# Patient Record
Sex: Male | Born: 1950
Health system: Southern US, Community
[De-identification: ages and names within clinical notes are randomized; demographics above are authoritative.]

## PROBLEM LIST (undated history)

## (undated) DIAGNOSIS — E785 Hyperlipidemia, unspecified: Secondary | ICD-10-CM

## (undated) DIAGNOSIS — E119 Type 2 diabetes mellitus without complications: Secondary | ICD-10-CM

---

## 2000-01-05 ENCOUNTER — Encounter: Payer: Self-pay | Admitting: *Deleted

## 2000-01-05 ENCOUNTER — Encounter: Payer: Self-pay | Admitting: Emergency Medicine

## 2000-01-05 ENCOUNTER — Inpatient Hospital Stay (HOSPITAL_COMMUNITY): Admission: EM | Admit: 2000-01-05 | Discharge: 2000-01-07 | Payer: Self-pay | Admitting: Emergency Medicine

## 2000-01-15 ENCOUNTER — Encounter: Admission: RE | Admit: 2000-01-15 | Discharge: 2000-02-12 | Payer: Self-pay | Admitting: *Deleted

## 2000-07-04 ENCOUNTER — Emergency Department (HOSPITAL_COMMUNITY): Admission: EM | Admit: 2000-07-04 | Discharge: 2000-07-04 | Payer: Self-pay | Admitting: Emergency Medicine

## 2001-11-28 ENCOUNTER — Encounter: Payer: Self-pay | Admitting: *Deleted

## 2001-11-28 ENCOUNTER — Inpatient Hospital Stay (HOSPITAL_COMMUNITY): Admission: EM | Admit: 2001-11-28 | Discharge: 2001-11-29 | Payer: Self-pay | Admitting: Psychiatry

## 2001-12-05 ENCOUNTER — Emergency Department (HOSPITAL_COMMUNITY): Admission: EM | Admit: 2001-12-05 | Discharge: 2001-12-05 | Payer: Self-pay | Admitting: Emergency Medicine

## 2001-12-07 ENCOUNTER — Emergency Department (HOSPITAL_COMMUNITY): Admission: AC | Admit: 2001-12-07 | Discharge: 2001-12-07 | Payer: Self-pay

## 2001-12-07 ENCOUNTER — Encounter: Payer: Self-pay | Admitting: Emergency Medicine

## 2002-03-05 ENCOUNTER — Encounter: Admission: RE | Admit: 2002-03-05 | Discharge: 2002-03-05 | Payer: Self-pay | Admitting: Family Medicine

## 2002-03-05 ENCOUNTER — Encounter: Payer: Self-pay | Admitting: Family Medicine

## 2002-03-19 ENCOUNTER — Encounter: Payer: Self-pay | Admitting: Family Medicine

## 2002-03-19 ENCOUNTER — Encounter: Admission: RE | Admit: 2002-03-19 | Discharge: 2002-03-19 | Payer: Self-pay | Admitting: Family Medicine

## 2003-07-13 DIAGNOSIS — I639 Cerebral infarction, unspecified: Secondary | ICD-10-CM

## 2003-07-13 HISTORY — DX: Cerebral infarction, unspecified: I63.9

## 2004-10-01 ENCOUNTER — Encounter: Admission: RE | Admit: 2004-10-01 | Discharge: 2004-10-01 | Payer: Self-pay | Admitting: Rheumatology

## 2005-09-23 ENCOUNTER — Encounter: Admission: RE | Admit: 2005-09-23 | Discharge: 2005-09-23 | Payer: Self-pay | Admitting: Neurology

## 2006-10-13 ENCOUNTER — Encounter: Admission: RE | Admit: 2006-10-13 | Discharge: 2006-10-13 | Payer: Self-pay | Admitting: Family Medicine

## 2013-09-28 ENCOUNTER — Other Ambulatory Visit: Payer: Self-pay | Admitting: Family Medicine

## 2013-09-28 DIAGNOSIS — R945 Abnormal results of liver function studies: Principal | ICD-10-CM

## 2013-09-28 DIAGNOSIS — R7989 Other specified abnormal findings of blood chemistry: Secondary | ICD-10-CM

## 2013-10-03 ENCOUNTER — Ambulatory Visit
Admission: RE | Admit: 2013-10-03 | Discharge: 2013-10-03 | Disposition: A | Discharge status: Home / Self Care | Payer: Federal, State, Local not specified - PPO | Source: Ambulatory Visit | Attending: Family Medicine | Admitting: Family Medicine

## 2013-10-03 DIAGNOSIS — R945 Abnormal results of liver function studies: Principal | ICD-10-CM

## 2013-10-03 DIAGNOSIS — R7989 Other specified abnormal findings of blood chemistry: Secondary | ICD-10-CM

## 2014-11-13 DIAGNOSIS — Z0000 Encounter for general adult medical examination without abnormal findings: Secondary | ICD-10-CM | POA: Diagnosis not present

## 2014-11-13 DIAGNOSIS — N529 Male erectile dysfunction, unspecified: Secondary | ICD-10-CM | POA: Diagnosis not present

## 2014-11-13 DIAGNOSIS — Z1211 Encounter for screening for malignant neoplasm of colon: Secondary | ICD-10-CM | POA: Diagnosis not present

## 2014-11-13 DIAGNOSIS — N400 Enlarged prostate without lower urinary tract symptoms: Secondary | ICD-10-CM | POA: Diagnosis not present

## 2014-11-13 DIAGNOSIS — Z23 Encounter for immunization: Secondary | ICD-10-CM | POA: Diagnosis not present

## 2014-11-13 DIAGNOSIS — Z125 Encounter for screening for malignant neoplasm of prostate: Secondary | ICD-10-CM | POA: Diagnosis not present

## 2014-11-13 DIAGNOSIS — M549 Dorsalgia, unspecified: Secondary | ICD-10-CM | POA: Diagnosis not present

## 2014-11-13 DIAGNOSIS — D649 Anemia, unspecified: Secondary | ICD-10-CM | POA: Diagnosis not present

## 2014-11-13 DIAGNOSIS — Z8673 Personal history of transient ischemic attack (TIA), and cerebral infarction without residual deficits: Secondary | ICD-10-CM | POA: Diagnosis not present

## 2014-11-13 DIAGNOSIS — E780 Pure hypercholesterolemia: Secondary | ICD-10-CM | POA: Diagnosis not present

## 2014-11-13 DIAGNOSIS — Z87891 Personal history of nicotine dependence: Secondary | ICD-10-CM | POA: Diagnosis not present

## 2014-11-15 ENCOUNTER — Other Ambulatory Visit: Payer: Self-pay | Admitting: Family Medicine

## 2014-11-15 DIAGNOSIS — Z139 Encounter for screening, unspecified: Secondary | ICD-10-CM

## 2014-11-21 ENCOUNTER — Ambulatory Visit
Admission: RE | Admit: 2014-11-21 | Discharge: 2014-11-21 | Disposition: A | Discharge status: Home / Self Care | Payer: Medicare Other | Source: Ambulatory Visit | Attending: Family Medicine | Admitting: Family Medicine

## 2014-11-21 DIAGNOSIS — Z139 Encounter for screening, unspecified: Secondary | ICD-10-CM

## 2014-11-21 DIAGNOSIS — Z136 Encounter for screening for cardiovascular disorders: Secondary | ICD-10-CM | POA: Diagnosis not present

## 2014-11-21 DIAGNOSIS — Z1211 Encounter for screening for malignant neoplasm of colon: Secondary | ICD-10-CM | POA: Diagnosis not present

## 2014-11-21 DIAGNOSIS — E785 Hyperlipidemia, unspecified: Secondary | ICD-10-CM | POA: Diagnosis not present

## 2015-02-17 DIAGNOSIS — E780 Pure hypercholesterolemia: Secondary | ICD-10-CM | POA: Diagnosis not present

## 2015-03-31 DIAGNOSIS — Z23 Encounter for immunization: Secondary | ICD-10-CM | POA: Diagnosis not present

## 2015-05-26 DIAGNOSIS — E782 Mixed hyperlipidemia: Secondary | ICD-10-CM | POA: Diagnosis not present

## 2016-04-06 DIAGNOSIS — Z1211 Encounter for screening for malignant neoplasm of colon: Secondary | ICD-10-CM | POA: Diagnosis not present

## 2016-04-06 DIAGNOSIS — N400 Enlarged prostate without lower urinary tract symptoms: Secondary | ICD-10-CM | POA: Diagnosis not present

## 2016-04-06 DIAGNOSIS — D649 Anemia, unspecified: Secondary | ICD-10-CM | POA: Diagnosis not present

## 2016-04-06 DIAGNOSIS — Z23 Encounter for immunization: Secondary | ICD-10-CM | POA: Diagnosis not present

## 2016-04-06 DIAGNOSIS — R634 Abnormal weight loss: Secondary | ICD-10-CM | POA: Diagnosis not present

## 2016-04-06 DIAGNOSIS — Z8673 Personal history of transient ischemic attack (TIA), and cerebral infarction without residual deficits: Secondary | ICD-10-CM | POA: Diagnosis not present

## 2016-04-06 DIAGNOSIS — N529 Male erectile dysfunction, unspecified: Secondary | ICD-10-CM | POA: Diagnosis not present

## 2016-04-06 DIAGNOSIS — E782 Mixed hyperlipidemia: Secondary | ICD-10-CM | POA: Diagnosis not present

## 2016-04-06 DIAGNOSIS — Z125 Encounter for screening for malignant neoplasm of prostate: Secondary | ICD-10-CM | POA: Diagnosis not present

## 2016-04-06 DIAGNOSIS — Z0000 Encounter for general adult medical examination without abnormal findings: Secondary | ICD-10-CM | POA: Diagnosis not present

## 2016-04-06 DIAGNOSIS — M549 Dorsalgia, unspecified: Secondary | ICD-10-CM | POA: Diagnosis not present

## 2016-04-09 ENCOUNTER — Other Ambulatory Visit: Payer: Self-pay

## 2016-04-09 NOTE — Patient Outreach (Signed)
[place] [facility]) Care Management  04/09/2016  Anthony Carter [birth year] [id number]  REFERRAL DATE:  04/08/16  REFERRAL SOURCE:  Primary MD REFERRAL REASON:  Weight loss CONSENT: self/ patient  PROVIDER:  Dr. Mayra Neer  SOCIAL:  Lives alone Independent Can provide own transportation  SUBJECTIVE: Telephone call to patient regarding primary MD referral. HIPAA verified with patient.  Discussed and offered [facility] care management services. Patient verbally agreed to services.  Weight loss: Patient states he has been dealing with weight loss since the beginning of this year. Patient reports he has loss 15-20 lbs and continues to lose. Patient states he saw his primary MD on 04/06/16 and she voiced concern and recommended protein supplements.  Patient states his current weight is 181 lbs and his height is 5 ft 9 in.  Patient states he has not had a good appetite and states he has been under a lot of stress lately.  MEDICATIONS:  Patient states he is having trouble affording his cholesterol medication, simvastatin and medication cialis.  Patient states he has to go without it sometimes.  SOCIAL:  Patient states he has been under a lot of stress lately.  Patient states some of the stress is involving his children. Patient states he feels like this is why he has not been eating well lately. Patient verbalized interest in discussing advance directive Patient verbally agreed to next outreach with telephonic nurse case manager.   ASSESSMENT: PHQ2 - 2,  PHQ9 - 12.   Patient agreeable to additional follow up with telephonic nurse case manager, social worker and pharmacist.  PLAN: RNCM will refer patient to social worker and pharmacist [facility] will send patient [facility] care management welcome packet/ consent/ advance directive.  RNCM will send patient Education material on high protein/ calorie diet.   Quinn Plowman RN,BSN,CCM [facility] Telephonic  [phone]

## 2016-04-09 NOTE — Addendum Note (Signed)
Addended by: Quinn Plowman E on: 04/09/2016 01:13 PM   Modules accepted: Orders

## 2016-04-12 ENCOUNTER — Other Ambulatory Visit: Payer: Self-pay | Admitting: Licensed Clinical Social Worker

## 2016-04-12 NOTE — Patient Outreach (Signed)
[place] [facility]) Care Management  04/12/2016  SNOWDEN FAFARD [birth year] [id number]   Assessment-CSW completed initial outreach attempt today. CSW unable to reach patient successfully. CSW left a HIPPA compliant voice message encouraging patient to return call once available.  Plan-CSW will await return call or complete an additional outreach if needed.  Eula Fried, BSW, MSW, [email or web address] Phone: [phone] Fax: [phone]

## 2016-04-14 ENCOUNTER — Encounter: Payer: Self-pay | Admitting: Licensed Clinical Social Worker

## 2016-04-14 ENCOUNTER — Other Ambulatory Visit: Payer: Self-pay | Admitting: Licensed Clinical Social Worker

## 2016-04-14 NOTE — Patient Outreach (Signed)
[place] [facility]) Care Management  04/14/2016  Anthony Carter [birth year] [id number]   Assessment- CSW completed outreach to patient and patient answered successfully. Patient provided HIPPA verifications. CSW introduced self, reason for call and of [facility] services. Patient is agreeable to social work services and is agreeable to home visit next week in order to gain social work assistance with advance directives and mental health resources.  Plan-CSW will complete home visit on 04/20/16. CSW will send involvement letter to PCP.  THN CM Care Plan Problem One   Flowsheet Row Most Recent Value  Care Plan Problem One  Patient is in need of community resource support and advance directives  Role Documenting the Problem One  Clinical Social Worker  Care Plan for Problem One  Active  THN Long Term Goal (31-90 days)  Patient will gain mental health resources within 90 day days as evidenced by his ongoing depressive symptoms which has led weight loss  THN Long Term Goal Start Date  04/14/16  Interventions for Problem One Long Term Goal  CSW will complete home visit and will provide resource education on mental health resources and will provide re education as needed. CSW will encourage patient to discuss symptoms with all care providers.  THN CM Short Term Goal #1 (0-30 days)  Patient will be provided assistance to complete advance directives within 30 days as evidenced by his desire to complete advance directives   THN CM Short Term Goal #1 Start Date  04/14/16  Interventions for Short Term Goal #1  CSW will complete home visit within 30 days in order to assist with completing advance directives.        Eula Fried, BSW, MSW, [email or web address] Phone: [phone] Fax: [phone]

## 2016-04-15 ENCOUNTER — Ambulatory Visit: Payer: Self-pay

## 2016-04-16 ENCOUNTER — Other Ambulatory Visit: Payer: Self-pay

## 2016-04-16 NOTE — Patient Outreach (Signed)
I called Mr. Garry to determine need with assistance with medications.  I had to leave a HIPPA compliant message for the patient to call me back.  If I do not hear back from the patient today, I will call the patient back at a later date.   Deanne Coffer, PharmD, [place] [phone]

## 2016-04-19 ENCOUNTER — Other Ambulatory Visit: Payer: Self-pay

## 2016-04-20 ENCOUNTER — Other Ambulatory Visit: Payer: Self-pay | Admitting: Licensed Clinical Social Worker

## 2016-04-20 NOTE — Patient Outreach (Signed)
[name]) Care Management  04/20/2016  Anthony Carter [birth year] [id number]  Late entry for 10//17  Telephone call to patient regarding primary MD referral.  Unable to reach patient. HIPAA compliant voice message left with call back phone number.   PLAN; RNCM will attempt 2nd telephone call to patient within 1 week.   Quinn Plowman RN,BSN,CCM [facility] Telephonic  [phone]

## 2016-04-20 NOTE — Patient Outreach (Signed)
[place] [facility]) Care Management  04/20/2016  JOSPH RIMANDO 05-29-1951 [id number]   Assessment- CSW contacted patient before going to his residence in order to complete scheduled home visit but was unable to reach him. CSW arrived at patient's residence and knocked on the door for 20 minutes before leaving. CSW left two HIPPA compliant voice messages on his mobile number requesting that he contact CSW back to reschedule no show home visit appointment.  Plan-CSW will await for return call from patient or complete second outreach attempt within two weeks.  Eula Fried, BSW, MSW, [email or web address] Phone: [phone] Fax: [phone]

## 2016-04-21 ENCOUNTER — Ambulatory Visit: Payer: Self-pay

## 2016-04-23 ENCOUNTER — Other Ambulatory Visit: Payer: Self-pay

## 2016-04-23 NOTE — Patient Outreach (Signed)
Mr. Poitevint was called to discussed his options for assistance with his medications.  His PHI was verified.  He stated he had only spent about $100 out of pocket on his medications.  He was aware of the simvastatin price of about $2.09 and picked it up a few days ago.  He was also aware that Medicare does not cover Cialis and he does not meet the out of pocket spend to get assistant through the [facility].  Mr. Mcfeaters understands that we are not able to provide assistance with Cialis.  If there are other pharmacy issues that arise, I am happy to assist in the future.  I am closing the case to pharmacy.   Deanne Coffer, PharmD, [place] [phone]

## 2016-04-26 ENCOUNTER — Other Ambulatory Visit: Payer: Self-pay | Admitting: Licensed Clinical Social Worker

## 2016-04-26 NOTE — Patient Outreach (Signed)
[place] [facility]) Care Management  04/26/2016  Anthony Carter 1951/06/12 [id number]  Assessment-CSW completed second outreach attempt today to both contact numbers. CSW unable to reach patient successfully. CSW left a HIPPA compliant voice message encouraging patient to return call once available.  Plan-CSW will await return call or complete third and final outreach if needed within one week.  Eula Fried, BSW, MSW, [email or web address] Phone: [phone] Fax: [phone]

## 2016-04-29 DIAGNOSIS — Z1211 Encounter for screening for malignant neoplasm of colon: Secondary | ICD-10-CM | POA: Diagnosis not present

## 2016-04-30 ENCOUNTER — Other Ambulatory Visit: Payer: Self-pay | Admitting: Licensed Clinical Social Worker

## 2016-04-30 ENCOUNTER — Other Ambulatory Visit: Payer: Self-pay

## 2016-04-30 NOTE — Patient Outreach (Signed)
[place] [facility]) Care Management  04/30/2016  Anthony Carter Aug 11, 1950 [id number]  Second telephone call to patient.  Unable to reach patient at both listed phone numbers.  HIPAA compliant voice message left with call back phone number.   PLAN:  RNCM will attempt 3rd telephone outreach to patient within 1 week.   Quinn Plowman RN,BSN,CCM [facility] Telephonic  [phone]

## 2016-04-30 NOTE — Patient Outreach (Signed)
[place] [facility]) Care Management  04/30/2016  Anthony Carter 12-05-1950 [id number]   Assessment-CSW completed third outreach attempt today. CSW unable to reach patient successfully. CSW left a HIPPA compliant voice message encouraging patient to return call once available.  Plan-CSW will complete fourth and final outreach attempt within one week.   Eula Fried, BSW, MSW, [email or web address] Phone: [phone] Fax: [phone]

## 2016-05-03 ENCOUNTER — Ambulatory Visit: Payer: Self-pay

## 2016-05-03 ENCOUNTER — Other Ambulatory Visit: Payer: Self-pay | Admitting: Licensed Clinical Social Worker

## 2016-05-03 ENCOUNTER — Encounter: Payer: Self-pay | Admitting: Licensed Clinical Social Worker

## 2016-05-03 NOTE — Patient Outreach (Signed)
[place] [facility]) Care Management  05/03/2016  ORONDE KEMPEL January 08, 1951 [id number]   Assessment- CSW completed fourth and final outreach call but was unsuccessful in reaching patient. CSW left a HIPPA compliant voice message. CSW will send outreach barrier letter at this time.  Plan-CSW will send outreach barrier letter at this time and will await 10 business days before completing case closure.  Eula Fried, BSW, MSW, [email or web address] Phone: [phone] Fax: [phone]

## 2016-05-04 ENCOUNTER — Ambulatory Visit: Payer: Self-pay

## 2016-05-07 ENCOUNTER — Other Ambulatory Visit: Payer: Self-pay

## 2016-05-07 NOTE — Patient Outreach (Signed)
[place] [facility]) Care Management  05/07/2016  Anthony Carter 10-26-1950 [id number]  REFERRAL DATE:  04/08/16    REFERRAL SOURCE:  Primary MD REFERRAL REASON:  Weight loss CONSENT: self/ patient  PROVIDER:  Dr. Mayra Neer  SOCIAL:  Lives alone Independent Can provide own transportation  Third telephone call to patient regarding primary MD referral.  Unable to reach patient. HIPAA compliant voice message left with call back phone number.   PLAN; RNCM will send patient outreach letter to attempt contact.   Quinn Plowman RN,BSN,CCM [place] Telephonic  [phone]

## 2016-05-18 ENCOUNTER — Other Ambulatory Visit: Payer: Self-pay | Admitting: Licensed Clinical Social Worker

## 2016-05-18 ENCOUNTER — Encounter: Payer: Self-pay | Admitting: Licensed Clinical Social Worker

## 2016-05-18 NOTE — Patient Outreach (Signed)
[place] [facility]) Care Management  05/18/2016  Anthony Carter [birth year] [id number]   Assessment- CSW has been unable to maintain contact with patient. CSW mailed outreach letter requesting return phone call and has waited 10 business days to hear back without success. CSW will now complete social work discharge.  Plan-CSW will complete social work discharge. CSW will update [facility] RNCM and PCP.   Eula Fried, BSW, MSW, [email or web address] Phone: [phone] Fax: [phone]

## 2016-05-21 ENCOUNTER — Other Ambulatory Visit: Payer: Self-pay

## 2016-05-21 NOTE — Patient Outreach (Signed)
[place] [facility]) Care Management  05/21/2016  Anthony Carter [birth year] [id number]  No response from patient after 3 telephone attempts and letter outreach.    PLAN:  RNCM will refer patient to care management assistant to close due to being unable to contact. RNCM will notify patients primary MD of case closure.   Quinn Plowman RN,BSN,CCM [facility] Telephonic  [phone]

## 2016-07-16 DIAGNOSIS — E782 Mixed hyperlipidemia: Secondary | ICD-10-CM | POA: Diagnosis not present

## 2016-10-18 DIAGNOSIS — E782 Mixed hyperlipidemia: Secondary | ICD-10-CM | POA: Diagnosis not present

## 2016-12-28 DIAGNOSIS — H52229 Regular astigmatism, unspecified eye: Secondary | ICD-10-CM | POA: Diagnosis not present

## 2016-12-28 DIAGNOSIS — Z0100 Encounter for examination of eyes and vision without abnormal findings: Secondary | ICD-10-CM | POA: Diagnosis not present

## 2017-05-18 DIAGNOSIS — M549 Dorsalgia, unspecified: Secondary | ICD-10-CM | POA: Diagnosis not present

## 2017-05-18 DIAGNOSIS — D649 Anemia, unspecified: Secondary | ICD-10-CM | POA: Diagnosis not present

## 2017-05-18 DIAGNOSIS — Z0000 Encounter for general adult medical examination without abnormal findings: Secondary | ICD-10-CM | POA: Diagnosis not present

## 2017-05-18 DIAGNOSIS — E782 Mixed hyperlipidemia: Secondary | ICD-10-CM | POA: Diagnosis not present

## 2017-05-18 DIAGNOSIS — Z1389 Encounter for screening for other disorder: Secondary | ICD-10-CM | POA: Diagnosis not present

## 2017-05-18 DIAGNOSIS — N400 Enlarged prostate without lower urinary tract symptoms: Secondary | ICD-10-CM | POA: Diagnosis not present

## 2017-05-18 DIAGNOSIS — Z8673 Personal history of transient ischemic attack (TIA), and cerebral infarction without residual deficits: Secondary | ICD-10-CM | POA: Diagnosis not present

## 2017-05-18 DIAGNOSIS — N529 Male erectile dysfunction, unspecified: Secondary | ICD-10-CM | POA: Diagnosis not present

## 2017-05-18 DIAGNOSIS — Z23 Encounter for immunization: Secondary | ICD-10-CM | POA: Diagnosis not present

## 2017-06-30 DIAGNOSIS — Z1211 Encounter for screening for malignant neoplasm of colon: Secondary | ICD-10-CM | POA: Diagnosis not present

## 2017-06-30 DIAGNOSIS — D126 Benign neoplasm of colon, unspecified: Secondary | ICD-10-CM | POA: Diagnosis not present

## 2017-07-08 DIAGNOSIS — D126 Benign neoplasm of colon, unspecified: Secondary | ICD-10-CM | POA: Diagnosis not present

## 2018-01-03 DIAGNOSIS — I6930 Unspecified sequelae of cerebral infarction: Secondary | ICD-10-CM | POA: Insufficient documentation

## 2018-01-03 DIAGNOSIS — E782 Mixed hyperlipidemia: Secondary | ICD-10-CM | POA: Insufficient documentation

## 2018-01-04 DIAGNOSIS — M5442 Lumbago with sciatica, left side: Secondary | ICD-10-CM | POA: Insufficient documentation

## 2018-01-04 DIAGNOSIS — I739 Peripheral vascular disease, unspecified: Secondary | ICD-10-CM | POA: Insufficient documentation

## 2018-01-04 DIAGNOSIS — R351 Nocturia: Secondary | ICD-10-CM | POA: Insufficient documentation

## 2019-01-23 DIAGNOSIS — R7303 Prediabetes: Secondary | ICD-10-CM | POA: Insufficient documentation

## 2019-01-25 DIAGNOSIS — K219 Gastro-esophageal reflux disease without esophagitis: Secondary | ICD-10-CM | POA: Insufficient documentation

## 2019-07-13 DIAGNOSIS — I2699 Other pulmonary embolism without acute cor pulmonale: Secondary | ICD-10-CM

## 2019-07-13 HISTORY — DX: Other pulmonary embolism without acute cor pulmonale: I26.99

## 2019-11-07 ENCOUNTER — Ambulatory Visit: Payer: Medicare Other | Admitting: Physician Assistant

## 2019-11-07 ENCOUNTER — Other Ambulatory Visit: Payer: Self-pay

## 2019-11-07 VITALS — BP 133/72 | HR 80 | Temp 98.4°F | Resp 18 | Ht 69.0 in | Wt 176.0 lb

## 2019-11-07 DIAGNOSIS — M79674 Pain in right toe(s): Secondary | ICD-10-CM

## 2019-11-07 DIAGNOSIS — R7303 Prediabetes: Secondary | ICD-10-CM

## 2019-11-07 DIAGNOSIS — I6930 Unspecified sequelae of cerebral infarction: Secondary | ICD-10-CM

## 2019-11-07 DIAGNOSIS — E782 Mixed hyperlipidemia: Secondary | ICD-10-CM

## 2019-11-07 MED ORDER — ONETOUCH VERIO W/DEVICE KIT: 1.0000 [IU] | PACK | Freq: Two times a day (BID) | 0 refills | Status: DC

## 2019-11-07 MED ORDER — ONETOUCH VERIO VI STRP: ORAL_STRIP | 12 refills | Status: DC

## 2019-11-07 MED ORDER — METFORMIN HCL 500 MG PO TABS: 500.0000 mg | ORAL_TABLET | Freq: Every day | ORAL | 2 refills | Status: AC

## 2019-11-07 MED ORDER — ACCU-CHEK AVIVA PLUS VI STRP: ORAL_STRIP | 12 refills | Status: DC

## 2019-11-07 MED ORDER — ACCU-CHEK SOFTCLIX LANCETS MISC
12 refills | Status: DC
Start: 2019-11-07 — End: 2019-11-07

## 2019-11-07 MED ORDER — ONETOUCH DELICA LANCETS 33G MISC: 1.0000 [IU] | Freq: Two times a day (BID) | 9 refills | Status: DC
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
## 2019-11-07 NOTE — Patient Instructions (Addendum)
Your A1c was 6.3, this is considered prediabetes, I recommend lowering the sugar diet start Metformin 500 mg once daily in the morning with breakfast, and check your blood sugar every morning when you are fasting before breakfast and again before dinner.  Keep a log of your blood sugars for your primary care provider to review  Kennieth Rad, PA-C Physician Assistant [place] [email or web address]    Diabetes Mellitus and [place] care is an important part of your health, especially when you have diabetes. Diabetes may cause you to have problems because of poor blood flow (circulation) to your feet and legs, which can cause your skin to:  Become thinner and drier.  Break more easily.  Heal more slowly.  Peel and crack. You may also have nerve damage (neuropathy) in your legs and feet, causing decreased feeling in them. This means that you may not notice minor injuries to your feet that could lead to more serious problems. Noticing and addressing any potential problems early is the best way to prevent future foot problems. How to care for your feet Foot hygiene  Wash your feet daily with warm water and mild soap. Do not use hot water. Then, pat your feet and the areas between your toes until they are completely dry. Do not soak your feet as this can dry your skin.  Trim your toenails straight across. Do not dig under them or around the cuticle. File the edges of your nails with an emery board or nail file.  Apply a moisturizing lotion or petroleum jelly to the skin on your feet and to dry, brittle toenails. Use lotion that does not contain alcohol and is unscented. Do not apply lotion between your toes. Shoes and socks  Wear clean socks or stockings every day. Make sure they are not too tight. Do not wear knee-high stockings since they may decrease blood flow to your legs.  Wear shoes that fit properly and have enough  cushioning. Always look in your shoes before you put them on to be sure there are no objects inside.  To break in new shoes, wear them for just a few hours a day. This prevents injuries on your feet. Wounds, scrapes, corns, and calluses  Check your feet daily for blisters, cuts, bruises, sores, and redness. If you cannot see the bottom of your feet, use a mirror or ask someone for help.  Do not cut corns or calluses or try to remove them with medicine.  If you find a minor scrape, cut, or break in the skin on your feet, keep it and the skin around it clean and dry. You may clean these areas with mild soap and water. Do not clean the area with peroxide, alcohol, or iodine.  If you have a wound, scrape, corn, or callus on your foot, look at it several times a day to make sure it is healing and not infected. Check for: ? Redness, swelling, or pain. ? Fluid or blood. ? Warmth. ? Pus or a bad smell. General instructions  Do not cross your legs. This may decrease blood flow to your feet.  Do not use heating pads or hot water bottles on your feet. They may burn your skin. If you have lost feeling in your feet or legs, you may not know this is happening until it is too late.  Protect your feet from hot and cold by wearing shoes, such as at the beach or on hot pavement.  Schedule a complete foot exam at least once a year (annually) or more often if you have foot problems. If you have foot problems, report any cuts, sores, or bruises to your health care provider immediately. Contact a health care provider if:  You have a medical condition that increases your risk of infection and you have any cuts, sores, or bruises on your feet.  You have an injury that is not healing.  You have redness on your legs or feet.  You feel burning or tingling in your legs or feet.  You have pain or cramps in your legs and feet.  Your legs or feet are numb.  Your feet always feel cold.  You have pain around  a toenail. Get help right away if:  You have a wound, scrape, corn, or callus on your foot and: ? You have pain, swelling, or redness that gets worse. ? You have fluid or blood coming from the wound, scrape, corn, or callus. ? Your wound, scrape, corn, or callus feels warm to the touch. ? You have pus or a bad smell coming from the wound, scrape, corn, or callus. ? You have a fever. ? You have a red line going up your leg. Summary  Check your feet every day for cuts, sores, red spots, swelling, and blisters.  Moisturize feet and legs daily.  Wear shoes that fit properly and have enough cushioning.  If you have foot problems, report any cuts, sores, or bruises to your health care provider immediately.  Schedule a complete foot exam at least once a year (annually) or more often if you have foot problems. This information is not intended to replace advice given to you by your health care provider. Make sure you discuss any questions you have with your health care provider. Document Revised: 03/21/2019 Document Reviewed: 07/30/2016 Elsevier Patient Education  [place].  Preventing Type 2 Diabetes Mellitus Type 2 diabetes (type 2 diabetes mellitus) is a long-term (chronic) disease that affects blood sugar (glucose) levels. Normally, a hormone called insulin allows glucose to enter cells in the body. The cells use glucose for energy. In type 2 diabetes, one or both of these problems may be present:  The body does not make enough insulin.  The body does not respond properly to insulin that it makes (insulin resistance). Insulin resistance or lack of insulin causes excess glucose to build up in the blood instead of going into cells. As a result, high blood glucose (hyperglycemia) develops, which can cause many complications. Being overweight or obese and having an inactive (sedentary) lifestyle can increase your risk for diabetes. Type 2 diabetes can be delayed or prevented by making  certain nutrition and lifestyle changes. What nutrition changes can be made?   Eat healthy meals and snacks regularly. Keep a healthy snack with you for when you get hungry between meals, such as fruit or a handful of nuts.  Eat lean meats and proteins that are low in saturated fats, such as chicken, fish, egg whites, and beans. Avoid processed meats.  Eat plenty of fruits and vegetables and plenty of grains that have not been processed (whole grains). It is recommended that you eat: ? 1?2 cups of fruit every day. ? 2?3 cups of vegetables every day. ? 6?8 oz of whole grains every day, such as oats, whole wheat, bulgur, brown rice, quinoa, and millet.  Eat low-fat dairy products, such as milk, yogurt, and cheese.  Eat foods that contain healthy fats, such as  nuts, avocado, olive oil, and canola oil.  Drink water throughout the day. Avoid drinks that contain added sugar, such as soda or sweet tea.  Follow instructions from your health care provider about specific eating or drinking restrictions.  Control how much food you eat at a time (portion size). ? Check food labels to find out the serving sizes of foods. ? Use a kitchen scale to weigh amounts of foods.  Saute or steam food instead of frying it. Cook with water or broth instead of oils or butter.  Limit your intake of: ? Salt (sodium). Have no more than 1 tsp (2,400 mg) of sodium a day. If you have heart disease or high blood pressure, have less than ? tsp (1,500 mg) of sodium a day. ? Saturated fat. This is fat that is solid at room temperature, such as butter or fat on meat. What lifestyle changes can be made? Activity   Do moderate-intensity physical activity for at least 30 minutes on at least 5 days of the week, or as much as told by your health care provider.  Ask your health care provider what activities are safe for you. A mix of physical activities may be best, such as walking, swimming, cycling, and strength  training.  Try to add physical activity into your day. For example: ? Park in spots that are farther away than usual, so that you walk more. For example, park in a far corner of the parking lot when you go to the office or the grocery store. ? Take a walk during your lunch break. ? Use stairs instead of elevators or escalators. Weight Loss  Lose weight as directed. Your health care provider can determine how much weight loss is best for you and can help you lose weight safely.  If you are overweight or obese, you may be instructed to lose at least 5?7 % of your body weight. Alcohol and Tobacco   Limit alcohol intake to no more than 1 drink a day for nonpregnant women and 2 drinks a day for men. One drink equals 12 oz of beer, 5 oz of wine, or 1 oz of hard liquor.  Do not use any tobacco products, such as cigarettes, chewing tobacco, and e-cigarettes. If you need help quitting, ask your health care provider. Work With [place] Provider  Have your blood glucose tested regularly, as told by your health care provider.  Discuss your risk factors and how you can reduce your risk for diabetes.  Get screening tests as told by your health care provider. You may have screening tests regularly, especially if you have certain risk factors for type 2 diabetes.  Make an appointment with a diet and nutrition specialist (registered dietitian). A registered dietitian can help you make a healthy eating plan and can help you understand portion sizes and food labels. Why are these changes important?  It is possible to prevent or delay type 2 diabetes and related health problems by making lifestyle and nutrition changes.  It can be difficult to recognize signs of type 2 diabetes. The best way to avoid possible damage to your body is to take actions to prevent the disease before you develop symptoms. What can happen if changes are not made?  Your blood glucose levels may keep increasing. Having  high blood glucose for a long time is dangerous. Too much glucose in your blood can damage your blood vessels, heart, kidneys, nerves, and eyes.  You may develop prediabetes or type  2 diabetes. Type 2 diabetes can lead to many chronic health problems and complications, such as: ? Heart disease. ? Stroke. ? Blindness. ? Kidney disease. ? Depression. ? Poor circulation in the feet and legs, which could lead to surgical removal (amputation) in severe cases. Where to find support  Ask your health care provider to recommend a registered dietitian, diabetes educator, or weight loss program.  Look for local or online weight loss groups.  Join a gym, fitness club, or outdoor activity group, such as a walking club. Where to find more information To learn more about diabetes and diabetes prevention, visit:  American Diabetes Association (ADA): www.diabetes.[employer] of Diabetes and Digestive and Kidney Diseases: FindSpin.nl To learn more about healthy eating, visit:  The U.S. Department of Agriculture Scientist, research (physical sciences)), Choose My Plate: http://wiley-williams.com/  Office of Disease Prevention and Health Promotion (ODPHP), Dietary Guidelines: SurferLive.at Summary  You can reduce your risk for type 2 diabetes by increasing your physical activity, eating healthy foods, and losing weight as directed.  Talk with your health care provider about your risk for type 2 diabetes. Ask about any blood tests or screening tests that you need to have. This information is not intended to replace advice given to you by your health care provider. Make sure you discuss any questions you have with your health care provider. Document Revised: 10/20/2018 Document Reviewed: 08/19/2015 Elsevier Patient Education  [place].

## 2019-11-08 ENCOUNTER — Other Ambulatory Visit: Payer: Self-pay | Admitting: Physician Assistant

## 2019-11-08 ENCOUNTER — Other Ambulatory Visit: Payer: Medicare Other | Admitting: *Deleted

## 2019-11-08 ENCOUNTER — Telehealth: Payer: Self-pay

## 2019-11-08 DIAGNOSIS — E782 Mixed hyperlipidemia: Secondary | ICD-10-CM

## 2019-11-08 DIAGNOSIS — R7303 Prediabetes: Secondary | ICD-10-CM

## 2019-11-08 MED ORDER — ACCU-CHEK AVIVA PLUS VI STRP: ORAL_STRIP | 12 refills | Status: AC

## 2019-11-08 MED ORDER — ACCU-CHEK SOFTCLIX LANCETS MISC: 12 refills | Status: AC

## 2019-11-08 MED ORDER — ACCU-CHEK AVIVA PLUS W/DEVICE KIT: 1.0000 [IU] | PACK | Freq: Once | 0 refills | Status: AC

## 2019-11-08 NOTE — Telephone Encounter (Signed)
Pharmacy called stating that insurance does not cover the meter & testing supplies that were sent in. Insurance prefers True Metrix or Accu-Chek

## 2019-11-08 NOTE — Telephone Encounter (Signed)
Noted. Message will be forwarded to provider to change rx.

## 2019-11-10 LAB — MICROALBUMIN / CREATININE URINE RATIO
Creatinine, Urine: 34.6 mg/dL
Microalb/Creat Ratio: <9 mg/g creat (ref 0–29)
Microalbumin, Urine: <3 ug/mL

## 2019-11-10 LAB — COMP. METABOLIC PANEL (12)
AST: 35 IU/L (ref 0–40)
Albumin/Globulin Ratio: 1.2 (ref 1.2–2.2)
Albumin: 4.1 g/dL (ref 3.8–4.8)
Alkaline Phosphatase: 133 IU/L — ABNORMAL HIGH (ref 39–117)
BUN/Creatinine Ratio: 7 — ABNORMAL LOW (ref 10–24)
BUN: 8 mg/dL (ref 8–27)
Bilirubin Total: 0.8 mg/dL (ref 0.0–1.2)
Calcium: 9.3 mg/dL (ref 8.6–10.2)
Chloride: 102 mmol/L (ref 96–106)
Creatinine, Ser: 1.1 mg/dL (ref 0.76–1.27)
GFR calc Af Amer: 78 mL/min/{1.73_m2} (ref >59)
GFR calc non Af Amer: 68 mL/min/{1.73_m2} (ref >59)
Globulin, Total: 3.4 g/dL (ref 1.5–4.5)
Glucose: 130 mg/dL — ABNORMAL HIGH (ref 65–99)
Potassium: 4.1 mmol/L (ref 3.5–5.2)
Sodium: 137 mmol/L (ref 134–144)
Total Protein: 7.5 g/dL (ref 6.0–8.5)

## 2019-11-10 LAB — CBC WITH DIFFERENTIAL/PLATELET
Basophils Absolute: 0.1 10*3/uL (ref 0.0–0.2)
Basos: 1 %
EOS (ABSOLUTE): 0.5 10*3/uL — ABNORMAL HIGH (ref 0.0–0.4)
Eos: 8 %
Hematocrit: 46.1 % (ref 37.5–51.0)
Hemoglobin: 15.3 g/dL (ref 13.0–17.7)
Immature Grans (Abs): 0.1 10*3/uL (ref 0.0–0.1)
Immature Granulocytes: 1 %
Lymphocytes Absolute: 2.6 10*3/uL (ref 0.7–3.1)
Lymphs: 44 %
MCH: 27.2 pg (ref 26.6–33.0)
MCHC: 33.2 g/dL (ref 31.5–35.7)
MCV: 82 fL (ref 79–97)
Monocytes Absolute: 0.5 10*3/uL (ref 0.1–0.9)
Monocytes: 8 %
Neutrophils Absolute: 2.2 10*3/uL (ref 1.4–7.0)
Neutrophils: 38 %
Platelets: 203 10*3/uL (ref 150–450)
RBC: 5.62 x10E6/uL (ref 4.14–5.80)
RDW: 14 % (ref 11.6–15.4)
WBC: 5.8 10*3/uL (ref 3.4–10.8)

## 2019-11-10 LAB — LIPID PANEL
Chol/HDL Ratio: 3.9 ratio (ref 0.0–5.0)
Cholesterol, Total: 164 mg/dL (ref 100–199)
HDL: 42 mg/dL (ref >39)
LDL Chol Calc (NIH): 100 mg/dL — ABNORMAL HIGH (ref 0–99)
Triglycerides: 119 mg/dL (ref 0–149)
VLDL Cholesterol Cal: 22 mg/dL (ref 5–40)

## 2019-11-10 LAB — TSH: TSH: 2.48 u[IU]/mL (ref 0.450–4.500)

## 2019-11-15 ENCOUNTER — Telehealth: Payer: Self-pay

## 2019-11-15 NOTE — Telephone Encounter (Signed)
Staff reached out to patient by phone to communicate information on results. No answer.

## 2019-11-16 ENCOUNTER — Telehealth: Payer: Self-pay

## 2019-11-16 NOTE — Telephone Encounter (Signed)
Pt returned call to go over lab results pt is aware of results and doesn't have any questions or concerns

## 2020-02-17 ENCOUNTER — Encounter (HOSPITAL_COMMUNITY): Payer: Self-pay | Admitting: *Deleted

## 2020-02-17 ENCOUNTER — Emergency Department (HOSPITAL_COMMUNITY): Payer: Medicare HMO

## 2020-02-17 ENCOUNTER — Inpatient Hospital Stay (HOSPITAL_COMMUNITY)
Admission: EM | Admit: 2020-02-17 | Discharge: 2020-02-19 | DRG: 176 | Disposition: A | Discharge status: Home / Self Care | Payer: Medicare HMO | Attending: Internal Medicine | Admitting: Internal Medicine

## 2020-02-17 ENCOUNTER — Other Ambulatory Visit: Payer: Self-pay

## 2020-02-17 DIAGNOSIS — R7303 Prediabetes: Secondary | ICD-10-CM | POA: Diagnosis present

## 2020-02-17 DIAGNOSIS — Z83438 Family history of other disorder of lipoprotein metabolism and other lipidemia: Secondary | ICD-10-CM

## 2020-02-17 DIAGNOSIS — Z79899 Other long term (current) drug therapy: Secondary | ICD-10-CM

## 2020-02-17 DIAGNOSIS — K5900 Constipation, unspecified: Secondary | ICD-10-CM | POA: Diagnosis not present

## 2020-02-17 DIAGNOSIS — E871 Hypo-osmolality and hyponatremia: Secondary | ICD-10-CM | POA: Diagnosis present

## 2020-02-17 DIAGNOSIS — Z833 Family history of diabetes mellitus: Secondary | ICD-10-CM

## 2020-02-17 DIAGNOSIS — I2699 Other pulmonary embolism without acute cor pulmonale: Principal | ICD-10-CM | POA: Diagnosis present

## 2020-02-17 DIAGNOSIS — E119 Type 2 diabetes mellitus without complications: Secondary | ICD-10-CM | POA: Diagnosis present

## 2020-02-17 DIAGNOSIS — I6930 Unspecified sequelae of cerebral infarction: Secondary | ICD-10-CM

## 2020-02-17 DIAGNOSIS — N179 Acute kidney failure, unspecified: Secondary | ICD-10-CM | POA: Diagnosis present

## 2020-02-17 DIAGNOSIS — Z7982 Long term (current) use of aspirin: Secondary | ICD-10-CM

## 2020-02-17 DIAGNOSIS — Z885 Allergy status to narcotic agent status: Secondary | ICD-10-CM

## 2020-02-17 DIAGNOSIS — E785 Hyperlipidemia, unspecified: Secondary | ICD-10-CM | POA: Diagnosis present

## 2020-02-17 DIAGNOSIS — Z20822 Contact with and (suspected) exposure to covid-19: Secondary | ICD-10-CM | POA: Diagnosis present

## 2020-02-17 DIAGNOSIS — I69351 Hemiplegia and hemiparesis following cerebral infarction affecting right dominant side: Secondary | ICD-10-CM

## 2020-02-17 DIAGNOSIS — Z7984 Long term (current) use of oral hypoglycemic drugs: Secondary | ICD-10-CM

## 2020-02-17 DIAGNOSIS — I2609 Other pulmonary embolism with acute cor pulmonale: Secondary | ICD-10-CM

## 2020-02-17 HISTORY — DX: Hyperlipidemia, unspecified: E78.5

## 2020-02-17 HISTORY — DX: Type 2 diabetes mellitus without complications: E11.9

## 2020-02-17 LAB — TROPONIN I (HIGH SENSITIVITY)
Troponin I (High Sensitivity): 13 ng/L (ref <18)
Troponin I (High Sensitivity): 14 ng/L (ref <18)

## 2020-02-17 LAB — COMPREHENSIVE METABOLIC PANEL
ALT: 17 U/L (ref 0–44)
AST: 21 U/L (ref 15–41)
Albumin: 3.4 g/dL — ABNORMAL LOW (ref 3.5–5.0)
Alkaline Phosphatase: 96 U/L (ref 38–126)
Anion gap: 11 (ref 5–15)
BUN: 12 mg/dL (ref 8–23)
CO2: 26 mmol/L (ref 22–32)
Calcium: 9.6 mg/dL (ref 8.9–10.3)
Chloride: 96 mmol/L — ABNORMAL LOW (ref 98–111)
Creatinine, Ser: 1.31 mg/dL — ABNORMAL HIGH (ref 0.61–1.24)
GFR calc Af Amer: >60 mL/min (ref >60)
GFR calc non Af Amer: 55 mL/min — ABNORMAL LOW (ref >60)
Glucose, Bld: 138 mg/dL — ABNORMAL HIGH (ref 70–99)
Potassium: 4.5 mmol/L (ref 3.5–5.1)
Sodium: 133 mmol/L — ABNORMAL LOW (ref 135–145)
Total Bilirubin: 2.5 mg/dL — ABNORMAL HIGH (ref 0.3–1.2)
Total Protein: 8.2 g/dL — ABNORMAL HIGH (ref 6.5–8.1)

## 2020-02-17 LAB — HEMOGLOBIN A1C
Hgb A1c MFr Bld: 6.4 % — ABNORMAL HIGH (ref 4.8–5.6)
Mean Plasma Glucose: 136.98 mg/dL

## 2020-02-17 LAB — CBC
HCT: 44.5 % (ref 39.0–52.0)
Hemoglobin: 14.8 g/dL (ref 13.0–17.0)
MCH: 27.7 pg (ref 26.0–34.0)
MCHC: 33.3 g/dL (ref 30.0–36.0)
MCV: 83.3 fL (ref 80.0–100.0)
Platelets: 189 10*3/uL (ref 150–400)
RBC: 5.34 MIL/uL (ref 4.22–5.81)
RDW: 12.9 % (ref 11.5–15.5)
WBC: 12.7 10*3/uL — ABNORMAL HIGH (ref 4.0–10.5)
nRBC: 0 % (ref 0.0–0.2)

## 2020-02-17 LAB — SARS CORONAVIRUS 2 BY RT PCR (HOSPITAL ORDER, PERFORMED IN ~~LOC~~ HOSPITAL LAB): SARS Coronavirus 2: NEGATIVE

## 2020-02-17 LAB — LIPASE, BLOOD: Lipase: 22 U/L (ref 11–51)

## 2020-02-17 LAB — HEPARIN LEVEL (UNFRACTIONATED): Heparin Unfractionated: 0.48 IU/mL (ref 0.30–0.70)

## 2020-02-17 LAB — CBG MONITORING, ED: Glucose-Capillary: 108 mg/dL — ABNORMAL HIGH (ref 70–99)

## 2020-02-17 LAB — BRAIN NATRIURETIC PEPTIDE: B Natriuretic Peptide: 38.6 pg/mL (ref 0.0–100.0)

## 2020-02-17 MED ORDER — ACETAMINOPHEN 650 MG RE SUPP: 650.0000 mg | Freq: Four times a day (QID) | RECTAL | Status: DC | PRN

## 2020-02-17 MED ORDER — ACETAMINOPHEN 325 MG PO TABS: 650.0000 mg | ORAL_TABLET | Freq: Four times a day (QID) | ORAL | Status: DC | PRN

## 2020-02-17 MED ORDER — HYDROCODONE-ACETAMINOPHEN 5-325 MG PO TABS
1.0000 | ORAL_TABLET | Freq: Four times a day (QID) | ORAL | Status: DC | PRN
  Administered 2020-02-17: 1 via ORAL
  Filled 2020-02-17: qty 1

## 2020-02-17 MED ORDER — SODIUM CHLORIDE 0.9 % IV SOLN: Freq: Once | INTRAVENOUS | Status: AC

## 2020-02-17 MED ORDER — IOHEXOL 350 MG/ML SOLN
100.0000 mL | Freq: Once | INTRAVENOUS | Status: AC | PRN
  Administered 2020-02-17: 100 mL via INTRAVENOUS

## 2020-02-17 MED ORDER — FENTANYL CITRATE (PF) 100 MCG/2ML IJ SOLN
50.0000 ug | Freq: Once | INTRAMUSCULAR | Status: AC
  Administered 2020-02-17: 50 ug via INTRAVENOUS
  Filled 2020-02-17: qty 2

## 2020-02-17 MED ORDER — INSULIN ASPART 100 UNIT/ML ~~LOC~~ SOLN: 0.0000 [IU] | Freq: Three times a day (TID) | SUBCUTANEOUS | Status: DC

## 2020-02-17 MED ORDER — SODIUM CHLORIDE 0.9% FLUSH
3.0000 mL | Freq: Two times a day (BID) | INTRAVENOUS | Status: DC
  Administered 2020-02-18 – 2020-02-19 (×3): 3 mL via INTRAVENOUS

## 2020-02-17 MED ORDER — HEPARIN (PORCINE) 25000 UT/250ML-% IV SOLN
1300.0000 [IU]/h | INTRAVENOUS | Status: DC
  Administered 2020-02-17 – 2020-02-18 (×2): 1300 [IU]/h via INTRAVENOUS
  Filled 2020-02-17 (×2): qty 250

## 2020-02-17 MED ORDER — ONDANSETRON HCL 4 MG/2ML IJ SOLN: 4.0000 mg | Freq: Four times a day (QID) | INTRAMUSCULAR | Status: DC | PRN

## 2020-02-17 MED ORDER — ALBUTEROL SULFATE (2.5 MG/3ML) 0.083% IN NEBU: 2.5000 mg | INHALATION_SOLUTION | Freq: Four times a day (QID) | RESPIRATORY_TRACT | Status: DC | PRN

## 2020-02-17 MED ORDER — HEPARIN BOLUS VIA INFUSION
4700.0000 [IU] | Freq: Once | INTRAVENOUS | Status: AC
  Administered 2020-02-17: 4700 [IU] via INTRAVENOUS
  Filled 2020-02-17: qty 4700

## 2020-02-17 MED ORDER — ONDANSETRON HCL 4 MG PO TABS: 4.0000 mg | ORAL_TABLET | Freq: Four times a day (QID) | ORAL | Status: DC | PRN

## 2020-02-17 NOTE — ED Notes (Signed)
Report given to Elige Radon, RN

## 2020-02-17 NOTE — ED Notes (Signed)
Admitting provider at bedside.

## 2020-02-17 NOTE — ED Notes (Signed)
Pt to CT via WC

## 2020-02-17 NOTE — ED Provider Notes (Signed)
[facility] Emergency Department Provider Note MRN:  [id number]  Arrival date & time: 02/17/20     Chief Complaint   Neck Pain and Back Pain   History of Present Illness   Anthony Carter is a 69 y.o. year-old male with a history of stroke presenting to the ED with chief complaint of neck and back pain.  Gradually worsening pain to the bilateral neck, back, pain down the bilateral flanks into the lower abdomen, neck pain does seem to radiate into the chest occasionally.  Patient lifted a lawnmower last week but it did not cause any significant soreness, pain started 2 days ago, no trauma or any other inciting events.  Pain described as throbbing, severe.  Denies fever, no cough, no shortness of breath though the pain is worse with deep breaths.  Review of Systems  A complete 10 system review of systems was obtained and all systems are negative except as noted in the HPI and PMH.   Patient's Health History   History reviewed. No pertinent past medical history.  History reviewed. No pertinent surgical history.  Family History  Problem Relation Age of Onset  . Hyperlipidemia Mother   . Diabetes Mother   . Hyperlipidemia Father   . Diabetes Father   . Hyperlipidemia Brother     Social History   Socioeconomic History  . Marital status: Married    Spouse name: Not on file  . Number of children: Not on file  . Years of education: Not on file  . Highest education level: Not on file  Occupational History  . Not on file  Tobacco Use  . Smoking status: Never Smoker  . Smokeless tobacco: Never Used  Substance and Sexual Activity  . Alcohol use: Not Currently  . Drug use: Never  . Sexual activity: Not Currently  Other Topics Concern  . Not on file  Social History Narrative  . Not on file   Social Determinants of Health   Financial Resource Strain:   . Difficulty of Paying Living Expenses:   Food Insecurity:   . Worried About Charity fundraiser in  the Last Year:   . Arboriculturist in the Last Year:   Transportation Needs:   . Film/video editor (Medical):   Marland Kitchen Lack of Transportation (Non-Medical):   Physical Activity:   . Days of Exercise per Week:   . Minutes of Exercise per Session:   Stress:   . Feeling of Stress :   Social Connections:   . Frequency of Communication with Friends and Family:   . Frequency of Social Gatherings with Friends and Family:   . Attends Religious Services:   . Active Member of Clubs or Organizations:   . Attends Archivist Meetings:   Marland Kitchen Marital Status:   Intimate Partner Violence:   . Fear of Current or Ex-Partner:   . Emotionally Abused:   Marland Kitchen Physically Abused:   . Sexually Abused:      Physical Exam   Vitals:   02/17/20 0541 02/17/20 1402  BP: 129/82 (!) 109/92  Pulse: 95 93  Resp: 19 16  Temp: 98.6 F (37 C) (!) 97.5 F (36.4 C)  SpO2: 98% 98%    CONSTITUTIONAL: Well-appearing, appears mildly uncomfortable NEURO:  Alert and oriented x 3, no focal deficits EYES:  eyes equal and reactive ENT/NECK:  no LAD, no JVD CARDIO: Regular rate, well-perfused, normal S1 and S2 PULM:  CTAB no wheezing or rhonchi GI/GU:  normal bowel sounds, non-distended, non-tender MSK/SPINE:  No gross deformities, no edema SKIN:  no rash, atraumatic PSYCH:  Appropriate speech and behavior  *Additional and/or pertinent findings included in MDM below  Diagnostic and Interventional Summary    EKG Interpretation  Date/Time:  Sunday February 17 2020 05:57:30 EDT Ventricular Rate:  99 PR Interval:  168 QRS Duration: 74 QT Interval:  296 QTC Calculation: 379 R Axis:   13 Text Interpretation: Normal sinus rhythm Minimal voltage criteria for LVH, may be normal variant ( R in aVL ) Nonspecific T wave abnormality Abnormal ECG Confirmed by Gerlene Fee [phone]) on 02/17/2020 11:19:56 AM      Labs Reviewed  CBC - Abnormal; Notable for the following components:      Result Value   WBC 12.7 (*)      All other components within normal limits  COMPREHENSIVE METABOLIC PANEL - Abnormal; Notable for the following components:   Sodium 133 (*)    Chloride 96 (*)    Glucose, Bld 138 (*)    Creatinine, Ser 1.31 (*)    Total Protein 8.2 (*)    Albumin 3.4 (*)    Total Bilirubin 2.5 (*)    GFR calc non Af Amer 55 (*)    All other components within normal limits  LIPASE, BLOOD  HEPARIN LEVEL (UNFRACTIONATED)  TROPONIN I (HIGH SENSITIVITY)  TROPONIN I (HIGH SENSITIVITY)    CT Angio Chest/Abd/Pel for Dissection W and/or Wo Contrast  Final Result  Addendum 1 of 1  ADDENDUM REPORT: 02/17/2020 14:30    ADDENDUM:  Critical Value/emergent results were called by telephone at the time  of interpretation on 02/17/2020 at 2:27 pm to provider [facility] ,  who verbally acknowledged these results.      Electronically Signed    By: Suzy Bouchard M.D.    On: 02/17/2020 14:30      Final      Medications  heparin bolus via infusion 4,700 Units (has no administration in time range)  heparin ADULT infusion 100 units/mL (25000 units/258mL sodium chloride 0.45%) (has no administration in time range)  fentaNYL (SUBLIMAZE) injection 50 mcg (50 mcg Intravenous Given 02/17/20 1208)  iohexol (OMNIPAQUE) 350 MG/ML injection 100 mL (100 mLs Intravenous Contrast Given 02/17/20 1333)     Procedures  /  Critical Care .Critical Care Performed by: Maudie Flakes, MD Authorized by: Maudie Flakes, MD   Critical care provider statement:    Critical care time (minutes):  34   Critical care was necessary to treat or prevent imminent or life-threatening deterioration of the following conditions: Bilateral acute pulmonary embolism with heart strain.   Critical care was time spent personally by me on the following activities:  Discussions with consultants, evaluation of patient's response to treatment, examination of patient, ordering and performing treatments and interventions, ordering and review of  laboratory studies, ordering and review of radiographic studies, pulse oximetry, re-evaluation of patient's condition, obtaining history from patient or surrogate and review of old charts    ED Course and Medical Decision Making  I have reviewed the triage vital signs, the nursing notes, and pertinent available records from the EMR.  Listed above are laboratory and imaging tests that I personally ordered, reviewed, and interpreted and then considered in my medical decision making (see below for details).      Given the description of pain and patient's age and risk factors there is some concern for aortic dissection, will obtain CT imaging.  CT study revealing  bilateral pulmonary emboli with infarct and mild heart strain, will consult with pulmonary critical care given the heart strain, however patient is hemodynamically stable and may be appropriate for stepdown or hospital service.  Evaluated by critical care, deemed appropriate for stepdown or floor with telemetry, will admit to hospital service.  Barth Kirks. [place] Small, [place] [email or web address]  Final Clinical Impressions(s) / ED Diagnoses     ICD-10-CM   1. Acute pulmonary embolism with acute cor pulmonale, unspecified pulmonary embolism type ([place])  I26.09     ED Discharge Orders    None       Discharge Instructions Discussed with and Provided to Patient:   Discharge Instructions   None       Maudie Flakes, MD 02/17/20 1534

## 2020-02-17 NOTE — Progress Notes (Signed)
[place] for heparin dosing Indication: pulmonary embolus  Allergies  Allergen Reactions  . Codeine Nausea And Vomiting    Patient Measurements: Height: 5\' 9"  (175.3 cm) Weight: 76.9 kg (169 lb 8.5 oz) IBW/kg (Calculated) : 70.7 Heparin Dosing Weight:   Vital Signs: Temp: 99.5 F (37.5 C) (08/08 2123) Temp Source: Oral (08/08 2123) BP: 125/68 (08/08 2123) Pulse Rate: 85 (08/08 2123)  Labs: Recent Labs    02/17/20 1208 02/17/20 1510 02/17/20 2131  HGB 14.8  --   --   HCT 44.5  --   --   PLT 189  --   --   HEPARINUNFRC  --   --  0.48  CREATININE 1.31*  --   --   TROPONINIHS 13 14  --     Estimated Creatinine Clearance: 52.5 mL/min (A) (by C-G formula based on SCr of 1.31 mg/dL (H)).   Medical History: Past Medical History:  Diagnosis Date  . 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 Diabetes ([place])   . HLD (hyperlipidemia)     Assessment: 69 y.o. M presented with right neck pain radiating down to lower back. CTA chest positive for acute PE with CT evidence of right heart strain. No AC PTA. Hgb/Hct stable, Plt wnl. Scr 1.31.   Goal of Therapy:  Heparin level 0.3-0.7 units/ml Monitor platelets by anticoagulation protocol: Yes   Plan:  Continue heparin infusion at 1300 units/hr Daily heparin level and CBC. Continue to monitor H&H and platelets  [place] Crane, Roylene Reason, BCCP Clinical Pharmacist  02/17/2020 10:27 PM   [facility] pharmacy phone numbers are listed on [email or web address]  Please check [email or web address] for unit-specific pharmacy phone numbers.

## 2020-02-17 NOTE — H&P (Addendum)
History and Physical    Anthony Carter RJJ:[id number] DOB: [birth year] DOA: 02/17/2020  Referring MD/NP/PA: Gerlene Fee, MD PCP: Mayra Neer, MD  Patient coming from: home  Chief Complaint: Pain  I have personally briefly reviewed patient's old medical records in [place]   HPI: Anthony Carter is a 69 y.o. 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  Prior to onset of symptoms he had been picking up a lawn more to move him and his neighbors lawn.  Complains of having pulsating severe pain in both of his shoulders.  Pain radiated across his shoulder blades, down his back to his buttock, and back across.  He complained of having difficulty breathing related with pain.  He admits to having associated symptoms of some lower leg pains.  He had been previously traveling back and forth from [place] where he used to live, but had not done so in a month.  He states that the fluid bolus told him that his blood was thick and he always clotted fast, but is not sure why.  Patient had been evaluated last year for an abnormal stress test, but reported that cardiac catheterization showed no acute abnormalities.  Lastly he also reports that he was told that he had a abnormal heart rhythm and that possibly led to his previous stroke.  He takes a daily aspirin, but is not on any blood thinners baseline.  ED Course: Upon admission into the emergency department patient was seen to be afebrile, respirations 16-26, and all other vital signs maintained.  Labs significant for WBC 12.7, sodium 133, BUN 12, creatinine 1.31, and troponin 13.  Bilateral lower lobe acute pulmonary emboli with right heart strain and bilateral pulmonary infarctions.  Patient was started on heparin drip.  TRH called to admit.  Review of Systems  Constitutional: Negative for fever and  malaise/fatigue.  HENT: Negative for congestion and nosebleeds.   Eyes: Negative for photophobia and pain.  Respiratory: Positive for shortness of breath. Negative for cough.   Cardiovascular: Negative for leg swelling.  Gastrointestinal: Negative for abdominal pain, nausea and vomiting.  Genitourinary: Negative for hematuria.  Musculoskeletal: Positive for back pain and myalgias. Negative for falls.  Skin: Negative for itching.  Neurological: Negative for focal weakness and loss of consciousness.  Psychiatric/Behavioral: Negative for memory loss and substance abuse.    Past Medical History:  Diagnosis Date  . Diabetes ([place])     History reviewed. No pertinent surgical history.   reports that he has never smoked. He has never used smokeless tobacco. He reports previous alcohol use. He reports that he does not use drugs.  Allergies  Allergen Reactions  . Codeine Nausea And Vomiting    Family History  Problem Relation Age of Onset  . Hyperlipidemia Mother   . Diabetes Mother   . Hyperlipidemia Father   . Diabetes Father   . Hyperlipidemia Brother     Prior to Admission medications   Medication Sig Start Date End Date Taking? Authorizing Provider  aspirin EC 81 MG tablet Take 81 mg by mouth daily after supper.    Yes Mayra Neer, MD  ibuprofen (ADVIL) 200 MG tablet Take 200 mg by mouth every 6 (six) hours as needed for headache (pain).   Yes [provider]  Multiple Vitamin (MULTIVITAMIN WITH MINERALS) TABS tablet Take 1 tablet by mouth daily after supper.  Yes [provider]  Accu-Chek Softclix Lancets lancets Use as instructed 11/08/19   Mayers, Cari S, PA-C  glucose blood (ACCU-CHEK AVIVA PLUS) test strip Use as instructed 11/08/19   Mayers, Cari S, PA-C  metFORMIN (GLUCOPHAGE) 500 MG tablet Take 1 tablet (500 mg total) by mouth daily with breakfast. Patient not taking: Reported on 02/17/2020 11/07/19 02/05/20  Mayers, Loraine Grip, PA-C    Physical  Exam:  Constitutional: Elderly male currently in NAD, calm, comfortable Vitals:   02/17/20 0541 02/17/20 1402 02/17/20 1506 02/17/20 1527  BP: 129/82 (!) 109/92 133/84   Pulse: 95 93  94  Resp: 19 16  (!) 26  Temp: 98.6 F (37 C) (!) 97.5 F (36.4 C)    TempSrc: Oral Oral    SpO2: 98% 98%  99%  Weight:  79.4 kg     Eyes: PERRL, lids and conjunctivae normal ENMT: Mucous membranes are moist. Posterior pharynx clear of any exudate or lesions.  Neck: normal, supple, no masses, no thyromegaly Respiratory: clear to auscultation bilaterally, no wheezing, no crackles. Normal respiratory effort. No accessory muscle use.  Cardiovascular: Regular rate and rhythm, no murmurs / rubs / gallops. No extremity edema. 2+ pedal pulses. No carotid bruits.  Abdomen: no tenderness, no masses palpated. No hepatosplenomegaly. Bowel sounds positive.  Musculoskeletal: no clubbing / cyanosis. No joint deformity upper and lower extremities. Good ROM, no contractures. Normal muscle tone.  Skin: no rashes, lesions, ulcers. No induration Neurologic: CN 2-12 grossly intact. Sensation intact, DTR normal. Strength 5/5 in all 4.  Psychiatric: Normal judgment and insight. Alert and oriented x 3. Normal mood.     Labs on Admission: I have personally reviewed following labs and imaging studies  CBC: Recent Labs  Lab 02/17/20 1208  WBC 12.7*  HGB 14.8  HCT 44.5  MCV 83.3  PLT 101   Basic Metabolic Panel: Recent Labs  Lab 02/17/20 1208  NA 133*  K 4.5  CL 96*  CO2 26  GLUCOSE 138*  BUN 12  CREATININE 1.31*  CALCIUM 9.6   GFR: Estimated Creatinine Clearance: 52.5 mL/min (A) (by C-G formula based on SCr of 1.31 mg/dL (H)). Liver Function Tests: Recent Labs  Lab 02/17/20 1208  AST 21  ALT 17  ALKPHOS 96  BILITOT 2.5*  PROT 8.2*  ALBUMIN 3.4*   Recent Labs  Lab 02/17/20 1208  LIPASE 22   No results for input(s): AMMONIA in the last 168 hours. Coagulation Profile: No results for input(s):  INR, PROTIME in the last 168 hours. Cardiac Enzymes: No results for input(s): CKTOTAL, CKMB, CKMBINDEX, TROPONINI in the last 168 hours. BNP (last 3 results) No results for input(s): PROBNP in the last 8760 hours. HbA1C: No results for input(s): HGBA1C in the last 72 hours. CBG: No results for input(s): GLUCAP in the last 168 hours. Lipid Profile: No results for input(s): CHOL, HDL, LDLCALC, TRIG, CHOLHDL, LDLDIRECT in the last 72 hours. Thyroid Function Tests: No results for input(s): TSH, T4TOTAL, FREET4, T3FREE, THYROIDAB in the last 72 hours. Anemia Panel: No results for input(s): VITAMINB12, FOLATE, FERRITIN, TIBC, IRON, RETICCTPCT in the last 72 hours. Urine analysis: No results found for: COLORURINE, APPEARANCEUR, LABSPEC, PHURINE, GLUCOSEU, HGBUR, BILIRUBINUR, KETONESUR, PROTEINUR, UROBILINOGEN, NITRITE, LEUKOCYTESUR Sepsis Labs: No results found for this or any previous visit (from the past 240 hour(s)).   Radiological Exams on Admission: CT Angio Chest/Abd/Pel for Dissection W and/or Wo Contrast  Addendum Date: 02/17/2020   ADDENDUM REPORT: 02/17/2020 14:30 ADDENDUM: Critical Value/emergent results  were called by telephone at the time of interpretation on 02/17/2020 at 2:27 pm to provider [facility] , who verbally acknowledged these results. Electronically Signed   By: Suzy Bouchard M.D.   On: 02/17/2020 14:30   Result Date: 02/17/2020 CLINICAL DATA:  Chest pain, back pain. The LEFT lower neck pain. Concern for aortic dissection EXAM: CT ANGIOGRAPHY CHEST, ABDOMEN AND PELVIS TECHNIQUE: Non-contrast CT of the chest was initially obtained. Multidetector CT imaging through the chest, abdomen and pelvis was performed using the standard protocol during bolus administration of intravenous contrast. Multiplanar reconstructed images and MIPs were obtained and reviewed to evaluate the vascular anatomy. CONTRAST:  129mL OMNIPAQUE IOHEXOL 350 MG/ML SOLN COMPARISON:  None. FINDINGS: CTA CHEST  FINDINGS Cardiovascular: No intramural hematoma on noncontrast CT of the chest. Contrast infused imaging demonstrates no aortic dissection or aneurysm. Great vessels normal. There are large tubular filling defects within the proximal segmental pulmonary arteries of LEFT and RIGHT lower lobes (image 77/series 7 and image 87/7, for example). The RIGHT ventricular diameter (4.1 cm) to LEFT ventricular diameter (4.0 cm) ratio is equal to 1.0 Mediastinum/Nodes: No axillary or mediastinal adenopathy. No pericardial effusion. Esophagus normal. Lungs/Pleura: Rounded peripheral consolidation in the RIGHT lower lobe and LEFT lower lobe. Band like atelectasis lower lobes. Finding likely a combination of pulmonary infarctions and atelectasis. Musculoskeletal: No acute osseous abnormality. Review of the MIP images confirms the above findings. CTA ABDOMEN AND PELVIS FINDINGS VASCULAR Aorta: Abdominal aorta normal caliber without dissection or aneurysm. Celiac: Widely patent SMA: Widely patent Renals: A single patent renal arteries. IMA: patent Inflow: Normal Veins: Unremarkable Review of the MIP images confirms the above findings. NON-VASCULAR Hepatobiliary: No focal hepatic lesion. No biliary duct dilatation. Common bile duct is normal. Pancreas: Pancreas is normal. No ductal dilatation. No pancreatic inflammation. Spleen: Normal spleen Adrenals/urinary tract: Adrenal glands and kidneys are normal. The ureters and bladder normal. Stomach/Bowel: Stomach, small bowel, appendix, and cecum are normal. The colon and rectosigmoid colon are normal. Vascular/Lymphatic: Abdominal aorta is normal caliber. No periportal or retroperitoneal adenopathy. No pelvic adenopathy. Reproductive: Prostate unremarkable Other: No free fluid. Musculoskeletal: No aggressive osseous lesion. Review of the MIP images confirms the above findings. IMPRESSION: 1. Bilateral lower lobe acute pulmonary emboli in the proximal segmental pulmonary arteries. Overall  clot burden moderate to severe. 2. CT evidence of mild RIGHT ventricular strain. Positive for acute PE with CT evidence of right heart strain (RV/LV Ratio = 1.0) consistent with at least submassive (intermediate risk) PE. The presence of right heart strain has been associated with an increased risk of morbidity and mortality. Please refer to the "PE Focused" order set in EPIC. 3. Bilateral lower lobe pulmonary infarctions and atelectasis. Electronically Signed: By: Suzy Bouchard M.D. On: 02/17/2020 14:24    EKG: Independently reviewed.  Sinus rhythm at 99 bpm.  Assessment/Plan Bilateral Pulmonary emboli with infarction: Patient presented with complaints of bilateral shoulder and back.  CT angiogram of the chest significant for bilateral pulmonary emboli with signs of right heart strain and pulmonary infarctions.  Patient started on heparin received.  He reports questionable history of irregular heart rhythm and having thick blood.   -Admit to a medical telemetry bed -Heparin per pharmacy -Bedrest for now -Check echocardiogram -Hydrocodone as needed for pain. -Determine best form of oral anticoagulation -Follow-up telemetry overnight and may need to go home with a Holter monitor -May benefit from referral to hematology/ oncology for work-up of a clotting disorder  Leukocytosis: WBC elevated at 12.7.  Suspect this is reactive in nature secondary to above as patient denies any other complaints.. -Continue to monitor  Acute kidney injury: Patient baseline creatinine previously noted to be around 1.  Patient presents with creatinine of 1.31.  Unclear cause of symptoms at this time. -Normal saline IV fluids at 75 mL/h overnight -Recheck creatinine in a.m.  Hyponatremia: Acute.  Sodium is mildly low at 133. -Recheck sodium levels after IV fluids  History of CVA: Patient reports prior history of stroke back in 2005 with mild residual right-sided weakness.  Reports history of irregular heart  rhythm.  He has been on a daily aspirin. -Hold aspirin  Prediabetes: Patient last hemoglobin A1c noted to be 6.1 back in 2019.  He is not on any diabetic medications at this time.  Initial glucose 138 on admission. -Check hemoglobin A1c   DVT prophylaxis: Heparin Code Status: Full Family Communication: Significant other updated at bedside Disposition Plan: Possible discharge home in 1 to 2 days Consults called: None Admission status: Observation  Norval Morton MD [facility] Pager [phone]   If 7PM-7AM, please contact night-coverage [email or web address] Password [facility]  02/17/2020, 3:44 PM

## 2020-02-17 NOTE — Consult Note (Addendum)
NAME:  Anthony Carter, MRN:  [id number], DOB:  06-23-1951, LOS: 0 ADMISSION DATE:  02/17/2020, CONSULTATION DATE: 02/17/20 REFERRING MD:  Norval Morton, MD CHIEF COMPLAINT:  Back pain  Brief History   Ms. Auxier is a 69 year old man with diabetes who presented to the ED with 2 to 3 days of right flank/back pain found to have bilateral pulmonary embolus with evidence of right heart strain on CT chest.  History of present illness   Patient was in normal state of health until 3 days ago.  He developed acute onset of right-sided flank or chest pain.  He says starts in the shoulder and goes down the right lateral side of his chest and wraps around to the front.  Thought it was hurt after moving a lawnmower around.  Not worse with inspiration or exertion.  Not improved with changes in position.  He has taken no medicine to help the pain.  He does endorse some mild change in his breathing although he is would not say he is short of breath.  He came to the emergency room where there was concern for aortic dissection based on description of symptoms.  CT chest with contrast demonstrated bilateral lower lobe acute pulmonary embolus with evidence of right heart strain on CT scan.  He was started on a heparin drip.  Troponin within normal limits, proBNP within normal limits.   No recent surgery or trauma.  No long plane, train, or automobile rides.  Last travel was only a 3 to 4-hour ride to [facility] 1 month ago.  No history of malignancy.  No family history of blood clot.  Review of recent UPC without evidence of significant proteinuria to suggest nephrotic syndrome.  Past Medical History  Diabetes, nonobstructive coronary disease   Procedures:  N/A  Significant Diagnostic Tests:  N/A  Micro Data:  N/A  Antimicrobials:  N/A  Interim history/subjective:  N/A  Objective   Blood pressure 133/84, pulse 93, temperature (!) 97.5 F (36.4 C), temperature source Oral, resp. rate 16, weight  79.4 kg, SpO2 98 %.       No intake or output data in the 24 hours ending 02/17/20 1513 Filed Weights   02/17/20 1402  Weight: 79.4 kg    Examination: General: Well-appearing in no acute distress Eyes: EOMI, no icterus Mouth: No lesions, dentition okay Neck: No JVP appreciated sitting upright, supple Lungs: Clear to auscultation bilaterally, no wheeze or crackle Cardiovascular: Regular rate and rhythm, no murmur Abdomen: Soft nontender, bowel sounds present MSK: No synovitis, no joint effusions Neuro: Strength normal, sensation intact Psych: Normal mood, full affect   Assessment & Plan:  Pulmonary embolus, submassive: Bilateral lower lobe PA acute clots. Troponin normal, BNP within normal limits, evidence of RH strain on CT.  Appears unprovoked - no recent surgery, immobilization, no evidence of significant proteinuria.  -- Heparin gtt for now, suspect he will be a candidate for oral anticoagulant at discharge --No role for systemic or catheter directed lytics at this time --obtain TTE --Consider outpatient referral to hematology given what appears to be unprovoked nature of PE  We will sign off.  Please contact us with any questions or concerns. Labs   CBC: Recent Labs  Lab 02/17/20 1208  WBC 12.7*  HGB 14.8  HCT 44.5  MCV 83.3  PLT 902    Basic Metabolic Panel: Recent Labs  Lab 02/17/20 1208  NA 133*  K 4.5  CL 96*  CO2 26  GLUCOSE 138*  BUN 12  CREATININE 1.31*  CALCIUM 9.6   GFR: Estimated Creatinine Clearance: 52.5 mL/min (A) (by C-G formula based on SCr of 1.31 mg/dL (H)). Recent Labs  Lab 02/17/20 1208  WBC 12.7*    Liver Function Tests: Recent Labs  Lab 02/17/20 1208  AST 21  ALT 17  ALKPHOS 96  BILITOT 2.5*  PROT 8.2*  ALBUMIN 3.4*   Recent Labs  Lab 02/17/20 1208  LIPASE 22   No results for input(s): AMMONIA in the last 168 hours.  ABG No results found for: PHART, PCO2ART, PO2ART, HCO3, TCO2, ACIDBASEDEF, O2SAT    Coagulation Profile: No results for input(s): INR, PROTIME in the last 168 hours.  Cardiac Enzymes: No results for input(s): CKTOTAL, CKMB, CKMBINDEX, TROPONINI in the last 168 hours.  HbA1C: No results found for: HGBA1C  CBG: No results for input(s): GLUCAP in the last 168 hours.  Review of Systems:   No cough or fever.  No dyspnea on exertion comprehensive review of systems otherwise negative.   Past Medical History  Diabetes  Surgical History   Left heart catheterization  Social History   reports that he has never smoked. He has never used smokeless tobacco. He reports previous alcohol use. He reports that he does not use drugs.   Family History   His family history includes Diabetes in his father and mother; Hyperlipidemia in his brother, father, and mother.   Allergies Allergies  Allergen Reactions  . Codeine Nausea And Vomiting     Home Medications  Prior to Admission medications   Medication Sig Start Date End Date Taking? Authorizing Provider  Accu-Chek Softclix Lancets lancets Use as instructed 11/08/19   Mayers, Loraine Grip, PA-C  aspirin EC 81 MG tablet Take 81 mg by mouth daily.    Mayra Neer, MD  cyclobenzaprine (FLEXERIL) 5 MG tablet Take by mouth. 01/23/19   [provider]  ezetimibe (ZETIA) 10 MG tablet Take by mouth. 01/23/19   [provider]  famotidine (PEPCID) 20 MG tablet Take by mouth. 01/23/19   [provider]  glucose blood (ACCU-CHEK AVIVA PLUS) test strip Use as instructed 11/08/19   Mayers, Cari S, PA-C  metFORMIN (GLUCOPHAGE) 500 MG tablet Take 1 tablet (500 mg total) by mouth daily with breakfast. 11/07/19 02/05/20  Mayers, Cari S, PA-C  Multiple Vitamin (MULTIVITAMIN) tablet Take 1 tablet by mouth daily.    Mayra Neer, MD  simvastatin (ZOCOR) 40 MG tablet Take 40 mg by mouth daily.    Mayra Neer, MD  tadalafil (CIALIS) 5 MG tablet Take 5 mg by mouth daily as needed for erectile dysfunction.    Mayra Neer, MD  tamsulosin (FLOMAX) 0.4 MG CAPS capsule Take by mouth. 01/23/19   [provider]

## 2020-02-17 NOTE — ED Notes (Signed)
Pt's dinner delivered

## 2020-02-17 NOTE — Progress Notes (Addendum)
ANTICOAGULATION CONSULT NOTE - Initial Consult  Pharmacy Consult for heparin dosing Indication: pulmonary embolus  Allergies  Allergen Reactions   Codeine Nausea And Vomiting    Patient Measurements: Weight: 79.4 kg (175 lb) Heparin Dosing Weight:   Vital Signs: Temp: 97.5 F (36.4 C) (08/08 1402) Temp Source: Oral (08/08 1402) BP: 109/92 (08/08 1402) Pulse Rate: 93 (08/08 1402)  Labs: Recent Labs    02/17/20 1208  HGB 14.8  HCT 44.5  PLT 189  CREATININE 1.31*  TROPONINIHS 13    Estimated Creatinine Clearance: 52.5 mL/min (A) (by C-G formula based on SCr of 1.31 mg/dL (H)).   Medical History: History reviewed. No pertinent past medical history.  Assessment: 69 y.o. M presented with right neck pain radiating down to lower back. CTA chest positive for acute PE with CT evidence of right heart strain. No AC PTA. Hgb/Hct stable, Plt wnl. Scr 1.31.   Goal of Therapy:  Heparin level 0.3-0.7 units/ml Monitor platelets by anticoagulation protocol: Yes   Plan:  Give heparin 4700 units bolus x 1 Start heparin infusion at 1300 units/hr Check anti-Xa level in 6 hours and daily while on heparin Continue to monitor H&H and platelets  Claudina Lick, PharmD PGY1 [place] Resident 02/17/2020 2:43 PM  Please check [email or web address] for unit-specific pharmacy phone numbers.

## 2020-02-17 NOTE — ED Triage Notes (Signed)
Pt is here with right neck pain that radiates down his back to his lower back.  Pt describes the pain as pulsating.  Pt states it increases with turning head and lifting arm.  Denies chest pain

## 2020-02-17 NOTE — ED Notes (Signed)
CCM at bedside 

## 2020-02-18 ENCOUNTER — Observation Stay (HOSPITAL_COMMUNITY): Payer: Medicare HMO

## 2020-02-18 DIAGNOSIS — N179 Acute kidney failure, unspecified: Secondary | ICD-10-CM | POA: Diagnosis present

## 2020-02-18 DIAGNOSIS — Z83438 Family history of other disorder of lipoprotein metabolism and other lipidemia: Secondary | ICD-10-CM | POA: Diagnosis not present

## 2020-02-18 DIAGNOSIS — E871 Hypo-osmolality and hyponatremia: Secondary | ICD-10-CM | POA: Diagnosis present

## 2020-02-18 DIAGNOSIS — Z7984 Long term (current) use of oral hypoglycemic drugs: Secondary | ICD-10-CM | POA: Diagnosis not present

## 2020-02-18 DIAGNOSIS — E785 Hyperlipidemia, unspecified: Secondary | ICD-10-CM | POA: Diagnosis present

## 2020-02-18 DIAGNOSIS — K5900 Constipation, unspecified: Secondary | ICD-10-CM | POA: Diagnosis not present

## 2020-02-18 DIAGNOSIS — R7303 Prediabetes: Secondary | ICD-10-CM

## 2020-02-18 DIAGNOSIS — I2699 Other pulmonary embolism without acute cor pulmonale: Secondary | ICD-10-CM | POA: Diagnosis present

## 2020-02-18 DIAGNOSIS — I6930 Unspecified sequelae of cerebral infarction: Secondary | ICD-10-CM | POA: Diagnosis not present

## 2020-02-18 DIAGNOSIS — Z79899 Other long term (current) drug therapy: Secondary | ICD-10-CM | POA: Diagnosis not present

## 2020-02-18 DIAGNOSIS — I69351 Hemiplegia and hemiparesis following cerebral infarction affecting right dominant side: Secondary | ICD-10-CM | POA: Diagnosis not present

## 2020-02-18 DIAGNOSIS — Z20822 Contact with and (suspected) exposure to covid-19: Secondary | ICD-10-CM | POA: Diagnosis present

## 2020-02-18 DIAGNOSIS — I2609 Other pulmonary embolism with acute cor pulmonale: Secondary | ICD-10-CM

## 2020-02-18 DIAGNOSIS — E119 Type 2 diabetes mellitus without complications: Secondary | ICD-10-CM | POA: Diagnosis present

## 2020-02-18 DIAGNOSIS — Z7982 Long term (current) use of aspirin: Secondary | ICD-10-CM | POA: Diagnosis not present

## 2020-02-18 DIAGNOSIS — Z885 Allergy status to narcotic agent status: Secondary | ICD-10-CM | POA: Diagnosis not present

## 2020-02-18 DIAGNOSIS — Z833 Family history of diabetes mellitus: Secondary | ICD-10-CM | POA: Diagnosis not present

## 2020-02-18 LAB — HIV ANTIBODY (ROUTINE TESTING W REFLEX): HIV Screen 4th Generation wRfx: NONREACTIVE

## 2020-02-18 LAB — BASIC METABOLIC PANEL
Anion gap: 8 (ref 5–15)
BUN: 12 mg/dL (ref 8–23)
CO2: 28 mmol/L (ref 22–32)
Calcium: 8.7 mg/dL — ABNORMAL LOW (ref 8.9–10.3)
Chloride: 97 mmol/L — ABNORMAL LOW (ref 98–111)
Creatinine, Ser: 1.2 mg/dL (ref 0.61–1.24)
GFR calc Af Amer: >60 mL/min (ref >60)
GFR calc non Af Amer: >60 mL/min (ref >60)
Glucose, Bld: 113 mg/dL — ABNORMAL HIGH (ref 70–99)
Potassium: 4 mmol/L (ref 3.5–5.1)
Sodium: 133 mmol/L — ABNORMAL LOW (ref 135–145)

## 2020-02-18 LAB — CBC
HCT: 38.6 % — ABNORMAL LOW (ref 39.0–52.0)
Hemoglobin: 12.7 g/dL — ABNORMAL LOW (ref 13.0–17.0)
MCH: 27.3 pg (ref 26.0–34.0)
MCHC: 32.9 g/dL (ref 30.0–36.0)
MCV: 82.8 fL (ref 80.0–100.0)
Platelets: 169 10*3/uL (ref 150–400)
RBC: 4.66 MIL/uL (ref 4.22–5.81)
RDW: 12.9 % (ref 11.5–15.5)
WBC: 9.8 10*3/uL (ref 4.0–10.5)
nRBC: 0 % (ref 0.0–0.2)

## 2020-02-18 LAB — ECHOCARDIOGRAM COMPLETE
Area-P 1/2: 3.21 cm2
Height: 69 in
S' Lateral: 2.8 cm
Weight: 2712 oz

## 2020-02-18 LAB — HEPARIN LEVEL (UNFRACTIONATED): Heparin Unfractionated: 0.41 IU/mL (ref 0.30–0.70)

## 2020-02-18 MED ORDER — DOCUSATE SODIUM 100 MG PO CAPS
100.0000 mg | ORAL_CAPSULE | Freq: Two times a day (BID) | ORAL | Status: DC
  Administered 2020-02-18 – 2020-02-19 (×3): 100 mg via ORAL
  Filled 2020-02-18 (×3): qty 1

## 2020-02-18 MED ORDER — POLYETHYLENE GLYCOL 3350 17 G PO PACK
17.0000 g | PACK | Freq: Every day | ORAL | Status: DC
  Administered 2020-02-18 – 2020-02-19 (×2): 17 g via ORAL
  Filled 2020-02-18 (×2): qty 1

## 2020-02-18 MED ORDER — APIXABAN 5 MG PO TABS: 5.0000 mg | ORAL_TABLET | Freq: Two times a day (BID) | ORAL | Status: DC

## 2020-02-18 MED ORDER — APIXABAN 5 MG PO TABS
10.0000 mg | ORAL_TABLET | Freq: Two times a day (BID) | ORAL | Status: DC
  Administered 2020-02-18 – 2020-02-19 (×3): 10 mg via ORAL
  Filled 2020-02-18 (×3): qty 2

## 2020-02-18 NOTE — Discharge Instructions (Signed)
Information on my medicine - ELIQUIS (apixaban)  This medication education was reviewed with me or my healthcare representative as part of my discharge preparation.  The pharmacist that spoke with me during my hospital stay was: Sloan Leiter, PharmD  Why was Eliquis prescribed for you? Eliquis was prescribed to treat blood clots that may have been found in the veins of your legs (deep vein thrombosis) or in your lungs (pulmonary embolism) and to reduce the risk of them occurring again.  What do You need to know about Eliquis ? The starting dose is 10 mg (two 5 mg tablets) taken TWICE daily for the FIRST SEVEN (7) DAYS, then on Monday 8/16 the dose is reduced to ONE 5 mg tablet taken TWICE daily.  Eliquis may be taken with or without food.   Try to take the dose about the same time in the morning and in the evening. If you have difficulty swallowing the tablet whole please discuss with your pharmacist how to take the medication safely.  Take Eliquis exactly as prescribed and DO NOT stop taking Eliquis without talking to the doctor who prescribed the medication.  Stopping may increase your risk of developing a new blood clot.  Refill your prescription before you run out.  After discharge, you should have regular check-up appointments with your healthcare provider that is prescribing your Eliquis.    What do you do if you miss a dose? If a dose of ELIQUIS is not taken at the scheduled time, take it as soon as possible on the same day and twice-daily administration should be resumed. The dose should not be doubled to make up for a missed dose.  Important Safety Information A possible side effect of Eliquis is bleeding. You should call your healthcare provider right away if you experience any of the following: ? Bleeding from an injury or your nose that does not stop. ? Unusual colored urine (red or dark brown) or unusual colored stools (red or black). ? Unusual bruising for unknown  reasons. ? A serious fall or if you hit your head (even if there is no bleeding).  Some medicines may interact with Eliquis and might increase your risk of bleeding or clotting while on Eliquis. To help avoid this, consult your healthcare provider or pharmacist prior to using any new prescription or non-prescription medications, including herbals, vitamins, non-steroidal anti-inflammatory drugs (NSAIDs) and supplements. Avoid regular ibuprofen use if possible for pain - try acetaminophen instead.   This website has more information on Eliquis (apixaban): http://www.eliquis.com/eliquis/home

## 2020-02-18 NOTE — Progress Notes (Signed)
Chaplain engaged in initial visit with Anthony Carter.  Chaplain provide education around Advanced Directive and offered support.  Chaplain explained to [name] to have her page or consulted when he is ready to have AD notarized.  Chaplain will follow-up.

## 2020-02-18 NOTE — Progress Notes (Signed)
  Echocardiogram 2D Echocardiogram has been performed.  Anthony Carter 02/18/2020, 11:04 AM

## 2020-02-18 NOTE — Progress Notes (Signed)
[place] for heparin >> Apixaban Indication: pulmonary embolus  Allergies  Allergen Reactions  . Codeine Nausea And Vomiting    Patient Measurements: Height: 5\' 9"  (175.3 cm) Weight: 76.9 kg (169 lb 8 oz) IBW/kg (Calculated) : 70.7 Heparin Dosing Weight:   Vital Signs: Temp: 98.5 F (36.9 C) (08/09 1159) Temp Source: Oral (08/09 1159) BP: 121/66 (08/09 1159) Pulse Rate: 75 (08/09 1159)  Labs: Recent Labs    02/17/20 1208 02/17/20 1510 02/17/20 2131 02/18/20 0503  HGB 14.8  --   --  12.7*  HCT 44.5  --   --  38.6*  PLT 189  --   --  169  HEPARINUNFRC  --   --  0.48 0.41  CREATININE 1.31*  --   --  1.20  TROPONINIHS 13 14  --   --     Estimated Creatinine Clearance: 57.3 mL/min (by C-G formula based on SCr of 1.2 mg/dL).   Medical History: Past Medical History:  Diagnosis Date  . 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 Diabetes ([place])   . HLD (hyperlipidemia)     Assessment: 69 y.o. M presented with right neck pain radiating down to lower back. CTA chest positive for acute PE with CT evidence of right heart strain. Pharmacy was consulted for IV Heparin initially - now to transition to Apixaban oral therapy. Patient was not on anticoagulation prior to admission.   Heparin level is therapeutic on 1300 units/hr.  Hgb/Hct stable, Plt wnl. Scr is trending down at 1.2.   Goal of Therapy:  Heparin level 0.3-0.7 units/ml Monitor platelets by anticoagulation protocol: Yes   Plan:  Stop IV Heparin now. Will discontinue heparin levels.  Start Apixaban therapy 10mg  po BID x7 days then on 02/25/20 reduce to 5mg  po BID.  Continue to monitor H&H and platelets Patient will need education to stop/limit use of NSAIDs for mild pain now on anticoagulation  Sloan Leiter, PharmD, BCPS, BCCCP Clinical Pharmacist Please refer to [facility] for [place] numbers  02/18/2020 1:23 PM   [facility] pharmacy phone  numbers are listed on [email or web address]  Please check [email or web address] for unit-specific pharmacy phone numbers.

## 2020-02-18 NOTE — Progress Notes (Signed)
PROGRESS NOTE    Anthony Carter  MVE:[id number] DOB: 09/04/1950 DOA: 02/17/2020 PCP: Anthony Neer, MD   Brief Narrative:   Ms. Anthony Carter is a 69 year old man with diabetes who presented to the ED with 2 to 3 days of right flank/back pain found to have bilateral pulmonary embolus with evidence of right heart strain on CT chest.  8/9: His respiratory status is a little better today; but still with pain w/ deep inspiration. He has not mobilized yet. We need to get him mobilized and test his O2 sats. He has chosen eliquis for oral AC. Will start this evening. Likely d/c in AM if stable.  Assessment & Plan:  Bilateral Pulmonary emboli with infarction      - CTA of the chest significant for bilateral pulmonary emboli with signs of right heart strain and pulmonary infarctions.     - Echo ordered/pending       - Heparin gtt; he has chosen eliquis for [place]. Will transition to that.     - PRN hydrocodone     - holter at discharge     - ambulatory referral to onco for hypercoag w/u at discharge     - he can stay on bed rest through today; need to mobilize w/ PT in AM.  Leukocytosis:     - WBC normalized  Acute kidney injury     - Patient baseline creatinine previously noted to be around 1.     - SCr was 1.3 at admission; now down to 1.2     - follow  Hyponatremia, mild     - Na+ 133 at admission; remains 133, outpt follow up  History of CVA     - Patient reports prior history of stroke back in 2005 with mild residual right-sided weakness.           - Reports history of irregular heart rhythm.       - He has been on a daily aspirin.     - ASA held; currently on heparin gtt, will be transitioned to eliquis, can continue ASA at discharge  Prediabetes     - Patient last hemoglobin A1c noted to be 6.1 back in 2019.     - A1c is 6.4. Lifestyle changes recommended. Follow up outpt.  DVT prophylaxis: heparin Code Status: FULL Family Communication: None at bedside   Status is:  Inpatient  Remains inpatient appropriate because:Inpatient level of care appropriate due to severity of illness   Dispo: The patient is from: Home              Anticipated d/c is to: Home              Anticipated d/c date is: 1 day              Patient currently is not medically stable to d/c.  Consultants:   Pulmonology  Procedures:   None   ROS:  Denies N, V, ab pain. Reports CP w/ deep inspiration. Remainder ROS is negative for all not previously mentioned.  Subjective: "That coumadin has me worried."  Objective: Vitals:   02/18/20 0100 02/18/20 0158 02/18/20 0454 02/18/20 1159  BP: 122/76  117/76 121/66  Pulse: 74  82 75  Resp: 19  17 18   Temp: 98 F (36.7 C)  99.6 F (37.6 C) 98.5 F (36.9 C)  TempSrc: Oral  Oral Oral  SpO2: 97%  97% 98%  Weight:  76.9 kg    Height:  Intake/Output Summary (Last 24 hours) at 02/18/2020 1316 Last data filed at 02/18/2020 1309 Gross per 24 hour  Intake 951.73 ml  Output 550 ml  Net 401.73 ml   Filed Weights   02/17/20 1402 02/17/20 1800 02/18/20 0158  Weight: 79.4 kg 76.9 kg 76.9 kg    Examination:  General: 69 y.o. male resting in bed in NAD Cardiovascular: RRR, +S1, S2, no m/g/r Respiratory: CTABL, no w/r/r, normal WOB GI: BS+, NDNT, soft MSK: No e/c/c Neuro: Alert to name, follows commands Psyc: Appropriate interaction and affect, calm/cooperative   Data Reviewed: I have personally reviewed following labs and imaging studies.  CBC: Recent Labs  Lab 02/17/20 1208 02/18/20 0503  WBC 12.7* 9.8  HGB 14.8 12.7*  HCT 44.5 38.6*  MCV 83.3 82.8  PLT 189 811   Basic Metabolic Panel: Recent Labs  Lab 02/17/20 1208 02/18/20 0503  NA 133* 133*  K 4.5 4.0  CL 96* 97*  CO2 26 28  GLUCOSE 138* 113*  BUN 12 12  CREATININE 1.31* 1.20  CALCIUM 9.6 8.7*   GFR: Estimated Creatinine Clearance: 57.3 mL/min (by C-G formula based on SCr of 1.2 mg/dL). Liver Function Tests: Recent Labs  Lab 02/17/20 1208   AST 21  ALT 17  ALKPHOS 96  BILITOT 2.5*  PROT 8.2*  ALBUMIN 3.4*   Recent Labs  Lab 02/17/20 1208  LIPASE 22   No results for input(s): AMMONIA in the last 168 hours. Coagulation Profile: No results for input(s): INR, PROTIME in the last 168 hours. Cardiac Enzymes: No results for input(s): CKTOTAL, CKMB, CKMBINDEX, TROPONINI in the last 168 hours. BNP (last 3 results) No results for input(s): PROBNP in the last 8760 hours. HbA1C: Recent Labs    02/17/20 2131  HGBA1C 6.4*   CBG: Recent Labs  Lab 02/17/20 1706  GLUCAP 108*   Lipid Profile: No results for input(s): CHOL, HDL, LDLCALC, TRIG, CHOLHDL, LDLDIRECT in the last 72 hours. Thyroid Function Tests: No results for input(s): TSH, T4TOTAL, FREET4, T3FREE, THYROIDAB in the last 72 hours. Anemia Panel: No results for input(s): VITAMINB12, FOLATE, FERRITIN, TIBC, IRON, RETICCTPCT in the last 72 hours. Sepsis Labs: No results for input(s): PROCALCITON, LATICACIDVEN in the last 168 hours.  Recent Results (from the past 240 hour(s))  SARS Coronavirus 2 by RT PCR (hospital order, performed in [facility] hospital lab) Nasopharyngeal Nasopharyngeal Swab     Status: None   Collection Time: 02/17/20  4:36 PM   Specimen: Nasopharyngeal Swab  Result Value Ref Range Status   SARS Coronavirus 2 NEGATIVE NEGATIVE Final    Comment: (NOTE) SARS-CoV-2 target nucleic acids are NOT DETECTED.  The SARS-CoV-2 RNA is generally detectable in upper and lower respiratory specimens during the acute phase of infection. The lowest concentration of SARS-CoV-2 viral copies this assay can detect is 250 copies / mL. A negative result does not preclude SARS-CoV-2 infection and should not be used as the sole basis for treatment or other patient management decisions.  A negative result may occur with improper specimen collection / handling, submission of specimen other than nasopharyngeal swab, presence of viral mutation(s) within the areas  targeted by this assay, and inadequate number of viral copies (<250 copies / mL). A negative result must be combined with clinical observations, patient history, and epidemiological information.  Fact Sheet for Patients:   StrictlyIdeas.no  Fact Sheet for Healthcare Providers: BankingDealers.co.za  This test is not yet approved or  cleared by the [place] FDA and has been  authorized for detection and/or diagnosis of SARS-CoV-2 by FDA under an Emergency Use Authorization (EUA).  This EUA will remain in effect (meaning this test can be used) for the duration of the COVID-19 declaration under Section 564(b)(1) of the Act, 21 U.S.C. section 360bbb-3(b)(1), unless the authorization is terminated or revoked sooner.  Performed at [facility], [place] [address], [place], [place] [address]       Radiology Studies: CT Angio Chest/Abd/Pel for Dissection W and/or Wo Contrast  Addendum Date: 02/17/2020   ADDENDUM REPORT: 02/17/2020 14:30 ADDENDUM: Critical Value/emergent results were called by telephone at the time of interpretation on 02/17/2020 at 2:27 pm to provider [facility] , who verbally acknowledged these results. Electronically Signed   By: Suzy Bouchard M.D.   On: 02/17/2020 14:30   Result Date: 02/17/2020 CLINICAL DATA:  Chest pain, back pain. The LEFT lower neck pain. Concern for aortic dissection EXAM: CT ANGIOGRAPHY CHEST, ABDOMEN AND PELVIS TECHNIQUE: Non-contrast CT of the chest was initially obtained. Multidetector CT imaging through the chest, abdomen and pelvis was performed using the standard protocol during bolus administration of intravenous contrast. Multiplanar reconstructed images and MIPs were obtained and reviewed to evaluate the vascular anatomy. CONTRAST:  190mL OMNIPAQUE IOHEXOL 350 MG/ML SOLN COMPARISON:  None. FINDINGS: CTA CHEST FINDINGS Cardiovascular: No intramural hematoma on noncontrast CT of the chest.  Contrast infused imaging demonstrates no aortic dissection or aneurysm. Great vessels normal. There are large tubular filling defects within the proximal segmental pulmonary arteries of LEFT and RIGHT lower lobes (image 77/series 7 and image 87/7, for example). The RIGHT ventricular diameter (4.1 cm) to LEFT ventricular diameter (4.0 cm) ratio is equal to 1.0 Mediastinum/Nodes: No axillary or mediastinal adenopathy. No pericardial effusion. Esophagus normal. Lungs/Pleura: Rounded peripheral consolidation in the RIGHT lower lobe and LEFT lower lobe. Band like atelectasis lower lobes. Finding likely a combination of pulmonary infarctions and atelectasis. Musculoskeletal: No acute osseous abnormality. Review of the MIP images confirms the above findings. CTA ABDOMEN AND PELVIS FINDINGS VASCULAR Aorta: Abdominal aorta normal caliber without dissection or aneurysm. Celiac: Widely patent SMA: Widely patent Renals: A single patent renal arteries. IMA: patent Inflow: Normal Veins: Unremarkable Review of the MIP images confirms the above findings. NON-VASCULAR Hepatobiliary: No focal hepatic lesion. No biliary duct dilatation. Common bile duct is normal. Pancreas: Pancreas is normal. No ductal dilatation. No pancreatic inflammation. Spleen: Normal spleen Adrenals/urinary tract: Adrenal glands and kidneys are normal. The ureters and bladder normal. Stomach/Bowel: Stomach, small bowel, appendix, and cecum are normal. The colon and rectosigmoid colon are normal. Vascular/Lymphatic: Abdominal aorta is normal caliber. No periportal or retroperitoneal adenopathy. No pelvic adenopathy. Reproductive: Prostate unremarkable Other: No free fluid. Musculoskeletal: No aggressive osseous lesion. Review of the MIP images confirms the above findings. IMPRESSION: 1. Bilateral lower lobe acute pulmonary emboli in the proximal segmental pulmonary arteries. Overall clot burden moderate to severe. 2. CT evidence of mild RIGHT ventricular strain.  Positive for acute PE with CT evidence of right heart strain (RV/LV Ratio = 1.0) consistent with at least submassive (intermediate risk) PE. The presence of right heart strain has been associated with an increased risk of morbidity and mortality. Please refer to the "PE Focused" order set in EPIC. 3. Bilateral lower lobe pulmonary infarctions and atelectasis. Electronically Signed: By: Suzy Bouchard M.D. On: 02/17/2020 14:24     Scheduled Meds: . sodium chloride flush  3 mL Intravenous Q12H   Continuous Infusions: . heparin 1,300 Units/hr (02/18/20 0706)  LOS: 0 days    Time spent: 25 minutes spent in the coordination of care today.    Jonnie Finner, DO [facility]  If 7PM-7AM, please contact night-coverage [email or web address] 02/18/2020, 1:16 PM

## 2020-02-19 ENCOUNTER — Other Ambulatory Visit: Payer: Self-pay | Admitting: Medical

## 2020-02-19 ENCOUNTER — Encounter: Payer: Self-pay | Admitting: *Deleted

## 2020-02-19 ENCOUNTER — Inpatient Hospital Stay (HOSPITAL_COMMUNITY): Payer: Medicare HMO

## 2020-02-19 DIAGNOSIS — I2699 Other pulmonary embolism without acute cor pulmonale: Secondary | ICD-10-CM

## 2020-02-19 DIAGNOSIS — K5900 Constipation, unspecified: Secondary | ICD-10-CM

## 2020-02-19 DIAGNOSIS — I6930 Unspecified sequelae of cerebral infarction: Secondary | ICD-10-CM

## 2020-02-19 LAB — CBC
HCT: 35.6 % — ABNORMAL LOW (ref 39.0–52.0)
Hemoglobin: 12.2 g/dL — ABNORMAL LOW (ref 13.0–17.0)
MCH: 28.1 pg (ref 26.0–34.0)
MCHC: 34.3 g/dL (ref 30.0–36.0)
MCV: 82 fL (ref 80.0–100.0)
Platelets: 183 10*3/uL (ref 150–400)
RBC: 4.34 MIL/uL (ref 4.22–5.81)
RDW: 12.7 % (ref 11.5–15.5)
WBC: 6.8 10*3/uL (ref 4.0–10.5)
nRBC: 0 % (ref 0.0–0.2)

## 2020-02-19 LAB — RENAL FUNCTION PANEL
Albumin: 2.5 g/dL — ABNORMAL LOW (ref 3.5–5.0)
Anion gap: 8 (ref 5–15)
BUN: 12 mg/dL (ref 8–23)
CO2: 25 mmol/L (ref 22–32)
Calcium: 8.5 mg/dL — ABNORMAL LOW (ref 8.9–10.3)
Chloride: 99 mmol/L (ref 98–111)
Creatinine, Ser: 1.13 mg/dL (ref 0.61–1.24)
GFR calc Af Amer: >60 mL/min
GFR calc non Af Amer: >60 mL/min
Glucose, Bld: 120 mg/dL — ABNORMAL HIGH (ref 70–99)
Phosphorus: 2.9 mg/dL (ref 2.5–4.6)
Potassium: 3.6 mmol/L (ref 3.5–5.1)
Sodium: 132 mmol/L — ABNORMAL LOW (ref 135–145)

## 2020-02-19 LAB — MAGNESIUM: Magnesium: 2 mg/dL (ref 1.7–2.4)

## 2020-02-19 MED ORDER — DOCUSATE SODIUM 100 MG PO CAPS: 100.0000 mg | ORAL_CAPSULE | Freq: Two times a day (BID) | ORAL | 0 refills | Status: AC

## 2020-02-19 MED ORDER — APIXABAN (ELIQUIS) VTE STARTER PACK (10MG AND 5MG)
ORAL_TABLET | ORAL | 0 refills | Status: DC
Start: 2020-02-19 — End: 2020-11-04

## 2020-02-19 MED ORDER — POLYETHYLENE GLYCOL 3350 17 G PO PACK: 17.0000 g | PACK | Freq: Every day | ORAL | 0 refills | Status: AC

## 2020-02-19 MED ORDER — BISACODYL 10 MG RE SUPP
10.0000 mg | Freq: Once | RECTAL | Status: AC
  Administered 2020-02-19: 10 mg via RECTAL
  Filled 2020-02-19: qty 1

## 2020-02-19 MED FILL — DOCUSATE SODIUM 100 MG CAPS: 100 | 10 days supply | Qty: 20 | Fill #0

## 2020-02-19 MED FILL — POLYETHYLENE GLYCOL 3350 PO: 17 | 14 days supply | Qty: 238 | Fill #0

## 2020-02-19 MED FILL — ELIQUIS STARTER PACK 5 MG T: 5 | 30 days supply | Qty: 74 | Fill #0

## 2020-02-19 NOTE — Evaluation (Signed)
Physical Therapy Evaluation Patient Details Name: Anthony Carter MRN: [id number] DOB: [birth year] Today's Date: 02/19/2020   History of Present Illness  Anthony Carter is a 69 year old man with diabetes who presented to the ED with 2 to 3 days of right flank/back pain found to have bilateral pulmonary embolus with evidence of right heart strain on CT chest.  Clinical Impression  Pt admitted with above diagnosis. Pt was able to ambulate without device with min guard assist to supervision.  No LOB with min challenges.  Scored well on DGI as well. Does not need O2 to keep sats >90%.  See O2 note.   Pt currently with functional limitations due to the deficits listed below (see PT Problem List). Pt will benefit from skilled PT to increase their independence and safety with mobility to allow discharge to the venue listed below.      Follow Up Recommendations No PT follow up    Equipment Recommendations  None recommended by PT    Recommendations for Other Services       Precautions / Restrictions Precautions Precautions: Fall Restrictions Weight Bearing Restrictions: No      Mobility  Bed Mobility Overal bed mobility: Independent                Transfers Overall transfer level: Independent                  Ambulation/Gait Ambulation/Gait assistance: Min guard;Supervision Gait Distance (Feet): 450 Feet Assistive device: None Gait Pattern/deviations: Step-through pattern;Decreased stride length   Gait velocity interpretation: 1.31 - 2.62 ft/sec, indicative of limited community ambulator General Gait Details: Pt was able to ambulate in hallway without device with min guard assist without LOB. Did perform some challenges to balance.    Stairs Stairs: Yes Stairs assistance: Min guard;Supervision Stair Management: One rail Right;Forwards;Alternating pattern Number of Stairs: 10 General stair comments: No issues on steps  Wheelchair Mobility    Modified Rankin (Stroke  Patients Only)       Balance                                 Standardized Balance Assessment Standardized Balance Assessment : Dynamic Gait Index   Dynamic Gait Index Level Surface: Normal Change in Gait Speed: Normal Gait with Horizontal Head Turns: Normal Gait with Vertical Head Turns: Normal Gait and Pivot Turn: Mild Impairment Step Over Obstacle: Normal Step Around Obstacles: Normal Steps: Mild Impairment Total Score: 22       Pertinent Vitals/Pain Pain Assessment: No/denies pain    Home Living Family/patient expects to be discharged to:: Private residence Living Arrangements: Alone Available Help at Discharge: Family;Available PRN/intermittently Type of Home: House Home Access: Stairs to enter Entrance Stairs-Rails: None Entrance Stairs-Number of Steps: 3 Home Layout: Two level;Bed/bath upstairs;1/2 bath on main level Home Equipment: Cane - single point      Prior Function Level of Independence: Independent         Comments: used cane occasionally     Hand Dominance   Dominant Hand: Right    Extremity/Trunk Assessment   Upper Extremity Assessment Upper Extremity Assessment: Defer to OT evaluation    Lower Extremity Assessment Lower Extremity Assessment: Generalized weakness    Cervical / Trunk Assessment Cervical / Trunk Assessment: Normal  Communication   Communication: No difficulties  Cognition Arousal/Alertness: Awake/alert Behavior During Therapy: WFL for tasks assessed/performed Overall Cognitive Status: Within Functional Limits for tasks assessed  General Comments General comments (skin integrity, edema, etc.): Pt sats were >90% with therapy on RA.  See O2 note.     Exercises     Assessment/Plan    PT Assessment Patient needs continued PT services  PT Problem List Decreased activity tolerance;Decreased balance;Decreased mobility;Decreased knowledge of use of  DME;Decreased safety awareness;Decreased knowledge of precautions       PT Treatment Interventions DME instruction;Gait training;Stair training;Functional mobility training;Therapeutic activities;Therapeutic exercise;Balance training;Patient/family education    PT Goals (Current goals can be found in the Care Plan section)  Acute Rehab PT Goals Patient Stated Goal: to go home PT Goal Formulation: With patient Time For Goal Achievement: 03/04/20 Potential to Achieve Goals: Good    Frequency Min 3X/week   Barriers to discharge        Co-evaluation               AM-PAC PT "6 Clicks" Mobility  Outcome Measure Help needed turning from your back to your side while in a flat bed without using bedrails?: None Help needed moving from lying on your back to sitting on the side of a flat bed without using bedrails?: None Help needed moving to and from a bed to a chair (including a wheelchair)?: None Help needed standing up from a chair using your arms (e.g., wheelchair or bedside chair)?: None Help needed to walk in hospital room?: None Help needed climbing 3-5 steps with a railing? : None 6 Click Score: 24    End of Session Equipment Utilized During Treatment: Gait belt Activity Tolerance: Patient tolerated treatment well Patient left: in chair;with call bell/phone within reach;with chair alarm set Nurse Communication: Mobility status PT Visit Diagnosis: Muscle weakness (generalized) (M62.81)    Time: 1027-2536 PT Time Calculation (min) (ACUTE ONLY): 27 min   Charges:   PT Evaluation $PT Eval Moderate Complexity: 1 Mod PT Treatments $Gait Training: 8-22 mins        Pessy Delamar W,PT Acute Rehabilitation Services Pager:  [phone]  Office:  [place] 02/19/2020, 12:29 PM

## 2020-02-19 NOTE — Progress Notes (Signed)
D/C instructions given and reviewed. No questions voiced at this time. Encouraged pt to call with any questions. Tele and IV removed. Tolerated well. Calling son for transport.

## 2020-02-19 NOTE — Progress Notes (Signed)
° °  [facility] asked to arrange an outpatient cardiac event monitor following hospitalization for bilateral PE's. Order placed for Dr. Debara Pickett to read (hospital DOD 02/19/20).   Abigail Butts, PA-C 02/19/20; 12:04 PM

## 2020-02-19 NOTE — Progress Notes (Signed)
SATURATION QUALIFICATIONS: (This note is used to comply with regulatory documentation for home oxygen)  Patient Saturations on Room Air at Rest = 93%  Patient Saturations on Room Air while Ambulating = 90%  Please briefly explain why patient needs home oxygen:  Does not need O2 as sats >90% on RA at rest and with activity.  Prerana Strayer W,PT Acute Rehabilitation Services Pager:  [phone]  Office:  [phone]

## 2020-02-19 NOTE — Progress Notes (Signed)
Patient ID: Anthony Carter, male   DOB: [birth year], 69 y.o.   MRN: [id number] Patient enrolled for [employer] to ship a 30 day cardiac event monitor to his home. Letter with instructions mailed to patient.

## 2020-02-19 NOTE — Care Management (Signed)
Benefit check submitted for DOACs

## 2020-02-19 NOTE — TOC Benefit Eligibility Note (Signed)
Transition of Care [facility]) Benefit Eligibility Note    Patient Details  Name: Anthony Carter MRN: [id number] Date of Birth: 12/21/1950   Medication/Dose: [name] 15mg  bid  and 20 mg daily 30 day supply / Eliquis 5 mg. bid 30 day supply  Covered?: Yes  Tier: 3 Drug  Prescription Coverage [facility]erred [facility] [employer]  Spoke with Person/Company/Phone Number:: [name] Ph# [phone]  Co-Pay: $47.00  Prior Approval: No  Deductible: Unmet       [name] Phone Number: 02/19/2020, 8:58 AM

## 2020-02-19 NOTE — Discharge Summary (Signed)
Physician Discharge Summary  Anthony Carter DGL:[id number] DOB: [birth year] DOA: 02/17/2020  PCP: Mayra Neer, MD  Admit date: 02/17/2020 Discharge date: 02/19/2020  Admitted From: Home Disposition:  Discharged to home.   Recommendations for Outpatient Follow-up:  1. Follow up with PCP in 1 weeks 2. Please obtain BMP/CBC in one week  Discharge Condition: Stable  CODE STATUS: FULL   Brief/Interim Summary: Ms. Anthony Carter is a 69 year old man with diabetes who presented to the ED with 2 to 3 days of right flank/back pain found to have bilateral pulmonary embolus with evidence of right heart strain on CT chest.  8/10: He is doing well today. Comfortable on RA. Was ambulated with PT. sPO2 remained 90%+ during 6MWT. He is stable for discharge. He will continue on eliquis. We will need to follow up with his PCP in 1 week. Discharge plan and instructions explained in detail. He has voiced understanding and agreement.   Discharge Diagnoses:  Bilateral Pulmonary emboli with infarction      - CTA of the chest significant for bilateral pulmonary emboli with signs of right heart strain and pulmonary infarctions.     - Echo ordered/pending       - intially on heparin gtt; he was transitioned to eliquis.     - PRN hydrocodone     - holter at discharge     - ambulatory referral to onco for hypercoag w/u at discharge     - while ambulating w/ PT, he maintained sats 90%+     - he is ok for discharge.  Leukocytosis:     - resolved  Acute kidney injury     - Patient baseline creatinine previously noted to be around 1.     - SCr was 1.3 at admission; now down to 1.1; follow up with PCP  Hyponatremia, mild     - Na+ 133 at admission; it remains stable at 132; follow up with PCP  History of CVA     - Patient reports prior history of stroke back in 2005 with mild residual right-sided weakness.           - Reports history of irregular heart rhythm.       - He has been on a daily aspirin.     - ASA  held; currently on heparin gtt, will be transitioned to eliquis, can continue ASA at discharge  Prediabetes     - Patient last hemoglobin A1c noted to be 6.1 back in 2019.     - A1c is 6.4. Lifestyle changes recommended. Follow up outpt.  Constipation     - miralax, colace, ducolax     - KUB: moderate stool     - continue miralax, colace at discharge; follow up with PCP   Discharge Instructions   Allergies as of 02/19/2020      Reactions   Codeine Nausea And Vomiting      Medication List    STOP taking these medications   ibuprofen 200 MG tablet Commonly known as: ADVIL     TAKE these medications   Accu-Chek Aviva Plus test strip Generic drug: glucose blood Use as instructed   Accu-Chek Softclix Lancets lancets Use as instructed   Apixaban Starter Pack (10mg  and 5mg ) Commonly known as: ELIQUIS STARTER PACK Take as directed on package: start with two-5mg  tablets twice daily for 7 days. On day 8, switch to one-5mg  tablet twice daily.   aspirin EC 81 MG tablet Take 81 mg by mouth daily after supper.  docusate sodium 100 MG capsule Commonly known as: COLACE Take 1 capsule (100 mg total) by mouth 2 (two) times daily for 10 days.   metFORMIN 500 MG tablet Commonly known as: GLUCOPHAGE Take 1 tablet (500 mg total) by mouth daily with breakfast.   multivitamin with minerals Tabs tablet Take 1 tablet by mouth daily after supper.   polyethylene glycol 17 g packet Commonly known as: MIRALAX / GLYCOLAX Take 17 g by mouth daily for 10 days. Start taking on: February 20, 2020       Allergies  Allergen Reactions  . Codeine Nausea And Vomiting    Consultations:  Pulmonology  Procedures/Studies: DG Abd 1 View  Result Date: 02/19/2020 CLINICAL DATA:  Abdominal distension with constipation EXAM: ABDOMEN - 1 VIEW COMPARISON:  None. FINDINGS: There is moderate stool in the colon. There is no colonic distension from stool. No bowel dilatation or air-fluid level to  suggest bowel obstruction. No free air. There are probable tiny phleboliths in the pelvis. IMPRESSION: Moderate stool in colon.  No bowel obstruction or free air. Electronically Signed   By: Lowella Grip III M.D.   On: 02/19/2020 09:37   ECHOCARDIOGRAM COMPLETE  Result Date: 02/18/2020    ECHOCARDIOGRAM REPORT   Patient Name:   Anthony Carter Date of Exam: 02/18/2020 Medical Rec #:  [id number]      Height:       69.0 in Accession #:    [id number]     Weight:       169.5 lb Date of Birth:  [birth year]      BSA:          1.926 m Patient Age:    42 years       BP:           117/76 mmHg Patient Gender: M              HR:           78 bpm. Exam Location:  Inpatient Procedure: 2D Echo Indications:    pulmonary embolus 415.19  History:        Patient has no prior history of Echocardiogram examinations.  Sonographer:    Johny Chess Referring Phys: [id number] RONDELL A SMITH IMPRESSIONS  1. Left ventricular ejection fraction, by estimation, is 60 to 65%. The left ventricle has normal function. The left ventricle has no regional wall motion abnormalities. Left ventricular diastolic parameters are consistent with Grade I diastolic dysfunction (impaired relaxation).  2. Right ventricular systolic function is normal. The right ventricular size is normal. Tricuspid regurgitation signal is inadequate for assessing PA pressure.  3. The mitral valve is grossly normal. Trivial mitral valve regurgitation. No evidence of mitral stenosis.  4. The aortic valve is tricuspid. Aortic valve regurgitation is not visualized. No aortic stenosis is present.  5. The inferior vena cava is normal in size with greater than 50% respiratory variability, suggesting right atrial pressure of 3 mmHg. FINDINGS  Left Ventricle: Left ventricular ejection fraction, by estimation, is 60 to 65%. The left ventricle has normal function. The left ventricle has no regional wall motion abnormalities. The left ventricular internal cavity size was normal in  size. There is  no left ventricular hypertrophy. Left ventricular diastolic parameters are consistent with Grade I diastolic dysfunction (impaired relaxation). Normal left ventricular filling pressure. Right Ventricle: The right ventricular size is normal. No increase in right ventricular wall thickness. Right ventricular systolic function is normal. Tricuspid regurgitation signal is inadequate for assessing PA  pressure. Left Atrium: Left atrial size was normal in size. Right Atrium: Right atrial size was normal in size. Pericardium: Trivial pericardial effusion is present. Mitral Valve: The mitral valve is grossly normal. Trivial mitral valve regurgitation. No evidence of mitral valve stenosis. Tricuspid Valve: The tricuspid valve is grossly normal. Tricuspid valve regurgitation is trivial. No evidence of tricuspid stenosis. Aortic Valve: The aortic valve is tricuspid. Aortic valve regurgitation is not visualized. No aortic stenosis is present. Pulmonic Valve: The pulmonic valve was grossly normal. Pulmonic valve regurgitation is not visualized. No evidence of pulmonic stenosis. Aorta: The aortic root and ascending aorta are structurally normal, with no evidence of dilitation. Venous: The inferior vena cava is normal in size with greater than 50% respiratory variability, suggesting right atrial pressure of 3 mmHg. IAS/Shunts: The atrial septum is grossly normal.  LEFT VENTRICLE PLAX 2D LVIDd:         4.70 cm  Diastology LVIDs:         2.80 cm  LV e' lateral:   9.14 cm/s LV PW:         0.80 cm  LV E/e' lateral: 6.9 LV IVS:        0.90 cm  LV e' medial:    6.20 cm/s LVOT diam:     2.10 cm  LV E/e' medial:  10.2 LV SV:         80 LV SV Index:   42 LVOT Area:     3.46 cm  RIGHT VENTRICLE         IVC TAPSE (M-mode): 1.8 cm  IVC diam: 1.40 cm LEFT ATRIUM             Index       RIGHT ATRIUM           Index LA diam:        3.80 cm 1.97 cm/m  RA Area:     10.60 cm LA Vol (A2C):   36.7 ml 19.06 ml/m RA Volume:   21.00  ml  10.91 ml/m LA Vol (A4C):   36.7 ml 19.06 ml/m LA Biplane Vol: 38.6 ml 20.04 ml/m  AORTIC VALVE LVOT Vmax:   112.00 cm/s LVOT Vmean:  73.700 cm/s LVOT VTI:    0.231 m  AORTA Ao Root diam: 2.90 cm Ao Asc diam:  2.80 cm MITRAL VALVE MV Area (PHT): 3.21 cm    SHUNTS MV Decel Time: 236 msec    Systemic VTI:  0.23 m MV E velocity: 63.40 cm/s  Systemic Diam: 2.10 cm MV A velocity: 89.50 cm/s MV E/A ratio:  0.71 Eleonore Chiquito MD Electronically signed by Eleonore Chiquito MD Signature Date/Time: 02/18/2020/2:29:39 PM    Final    CT Angio Chest/Abd/Pel for Dissection W and/or Wo Contrast  Addendum Date: 02/17/2020   ADDENDUM REPORT: 02/17/2020 14:30 ADDENDUM: Critical Value/emergent results were called by telephone at the time of interpretation on 02/17/2020 at 2:27 pm to provider [facility] , who verbally acknowledged these results. Electronically Signed   By: Suzy Bouchard M.D.   On: 02/17/2020 14:30   Result Date: 02/17/2020 CLINICAL DATA:  Chest pain, back pain. The LEFT lower neck pain. Concern for aortic dissection EXAM: CT ANGIOGRAPHY CHEST, ABDOMEN AND PELVIS TECHNIQUE: Non-contrast CT of the chest was initially obtained. Multidetector CT imaging through the chest, abdomen and pelvis was performed using the standard protocol during bolus administration of intravenous contrast. Multiplanar reconstructed images and MIPs were obtained and reviewed to evaluate the vascular anatomy. CONTRAST:  135mL OMNIPAQUE IOHEXOL 350 MG/ML SOLN COMPARISON:  None. FINDINGS: CTA CHEST FINDINGS Cardiovascular: No intramural hematoma on noncontrast CT of the chest. Contrast infused imaging demonstrates no aortic dissection or aneurysm. Great vessels normal. There are large tubular filling defects within the proximal segmental pulmonary arteries of LEFT and RIGHT lower lobes (image 77/series 7 and image 87/7, for example). The RIGHT ventricular diameter (4.1 cm) to LEFT ventricular diameter (4.0 cm) ratio is equal to 1.0  Mediastinum/Nodes: No axillary or mediastinal adenopathy. No pericardial effusion. Esophagus normal. Lungs/Pleura: Rounded peripheral consolidation in the RIGHT lower lobe and LEFT lower lobe. Band like atelectasis lower lobes. Finding likely a combination of pulmonary infarctions and atelectasis. Musculoskeletal: No acute osseous abnormality. Review of the MIP images confirms the above findings. CTA ABDOMEN AND PELVIS FINDINGS VASCULAR Aorta: Abdominal aorta normal caliber without dissection or aneurysm. Celiac: Widely patent SMA: Widely patent Renals: A single patent renal arteries. IMA: patent Inflow: Normal Veins: Unremarkable Review of the MIP images confirms the above findings. NON-VASCULAR Hepatobiliary: No focal hepatic lesion. No biliary duct dilatation. Common bile duct is normal. Pancreas: Pancreas is normal. No ductal dilatation. No pancreatic inflammation. Spleen: Normal spleen Adrenals/urinary tract: Adrenal glands and kidneys are normal. The ureters and bladder normal. Stomach/Bowel: Stomach, small bowel, appendix, and cecum are normal. The colon and rectosigmoid colon are normal. Vascular/Lymphatic: Abdominal aorta is normal caliber. No periportal or retroperitoneal adenopathy. No pelvic adenopathy. Reproductive: Prostate unremarkable Other: No free fluid. Musculoskeletal: No aggressive osseous lesion. Review of the MIP images confirms the above findings. IMPRESSION: 1. Bilateral lower lobe acute pulmonary emboli in the proximal segmental pulmonary arteries. Overall clot burden moderate to severe. 2. CT evidence of mild RIGHT ventricular strain. Positive for acute PE with CT evidence of right heart strain (RV/LV Ratio = 1.0) consistent with at least submassive (intermediate risk) PE. The presence of right heart strain has been associated with an increased risk of morbidity and mortality. Please refer to the "PE Focused" order set in EPIC. 3. Bilateral lower lobe pulmonary infarctions and atelectasis.  Electronically Signed: By: Suzy Bouchard M.D. On: 02/17/2020 14:24      Subjective: "I'm getting a follow up with my doctor."  Discharge Exam: Vitals:   02/18/20 2001 02/19/20 0442  BP: (!) 104/53 116/73  Pulse: 74 76  Resp: 20 18  Temp: 99 F (37.2 C) 98.7 F (37.1 C)  SpO2: 95% 95%   Vitals:   02/18/20 0454 02/18/20 1159 02/18/20 2001 02/19/20 0442  BP: 117/76 121/66 (!) 104/53 116/73  Pulse: 82 75 74 76  Resp: 17 18 20 18   Temp: 99.6 F (37.6 C) 98.5 F (36.9 C) 99 F (37.2 C) 98.7 F (37.1 C)  TempSrc: Oral Oral Oral Oral  SpO2: 97% 98% 95% 95%  Weight:    77.6 kg  Height:        General: 69 y.o. male resting in bed in NAD Cardiovascular: RRR, +S1, S2, no m/g/r, equal pulses throughout Respiratory: CTABL, no w/r/r, normal WOB GI: BS+, NDNT, no masses noted, no organomegaly noted MSK: No e/c/c Skin: No rashes, bruises, ulcerations noted Neuro: A&O x 3, no focal deficits Psyc: Appropriate interaction and affect, calm/cooperative  The results of significant diagnostics from this hospitalization (including imaging, microbiology, ancillary and laboratory) are listed below for reference.     Microbiology: Recent Results (from the past 240 hour(s))  SARS Coronavirus 2 by RT PCR (hospital order, performed in [facility] hospital lab) Nasopharyngeal Nasopharyngeal Swab  Status: None   Collection Time: 02/17/20  4:36 PM   Specimen: Nasopharyngeal Swab  Result Value Ref Range Status   SARS Coronavirus 2 NEGATIVE NEGATIVE Final    Comment: (NOTE) SARS-CoV-2 target nucleic acids are NOT DETECTED.  The SARS-CoV-2 RNA is generally detectable in upper and lower respiratory specimens during the acute phase of infection. The lowest concentration of SARS-CoV-2 viral copies this assay can detect is 250 copies / mL. A negative result does not preclude SARS-CoV-2 infection and should not be used as the sole basis for treatment or other patient management decisions.   A negative result may occur with improper specimen collection / handling, submission of specimen other than nasopharyngeal swab, presence of viral mutation(s) within the areas targeted by this assay, and inadequate number of viral copies (<250 copies / mL). A negative result must be combined with clinical observations, patient history, and epidemiological information.  Fact Sheet for Patients:   StrictlyIdeas.no  Fact Sheet for Healthcare Providers: BankingDealers.co.za  This test is not yet approved or  cleared by the [place] FDA and has been authorized for detection and/or diagnosis of SARS-CoV-2 by FDA under an Emergency Use Authorization (EUA).  This EUA will remain in effect (meaning this test can be used) for the duration of the COVID-19 declaration under Section 564(b)(1) of the Act, 21 U.S.C. section 360bbb-3(b)(1), unless the authorization is terminated or revoked sooner.  Performed at [facility], [place] [address], [place], [place] [address]      Labs: BNP (last 3 results) Recent Labs    02/17/20 1510  BNP 46.2   Basic Metabolic Panel: Recent Labs  Lab 02/17/20 1208 02/18/20 0503 02/19/20 0245  NA 133* 133* 132*  K 4.5 4.0 3.6  CL 96* 97* 99  CO2 26 28 25   GLUCOSE 138* 113* 120*  BUN 12 12 12   CREATININE 1.31* 1.20 1.13  CALCIUM 9.6 8.7* 8.5*  MG  --   --  2.0  PHOS  --   --  2.9   Liver Function Tests: Recent Labs  Lab 02/17/20 1208 02/19/20 0245  AST 21  --   ALT 17  --   ALKPHOS 96  --   BILITOT 2.5*  --   PROT 8.2*  --   ALBUMIN 3.4* 2.5*   Recent Labs  Lab 02/17/20 1208  LIPASE 22   No results for input(s): AMMONIA in the last 168 hours. CBC: Recent Labs  Lab 02/17/20 1208 02/18/20 0503 02/19/20 0245  WBC 12.7* 9.8 6.8  HGB 14.8 12.7* 12.2*  HCT 44.5 38.6* 35.6*  MCV 83.3 82.8 82.0  PLT 189 169 183   Cardiac Enzymes: No results for input(s): CKTOTAL, CKMB,  CKMBINDEX, TROPONINI in the last 168 hours. BNP: Invalid input(s): POCBNP CBG: Recent Labs  Lab 02/17/20 1706  GLUCAP 108*   D-Dimer No results for input(s): DDIMER in the last 72 hours. Hgb A1c Recent Labs    02/17/20 2131  HGBA1C 6.4*   Lipid Profile No results for input(s): CHOL, HDL, LDLCALC, TRIG, CHOLHDL, LDLDIRECT in the last 72 hours. Thyroid function studies No results for input(s): TSH, T4TOTAL, T3FREE, THYROIDAB in the last 72 hours.  Invalid input(s): FREET3 Anemia work up No results for input(s): VITAMINB12, FOLATE, FERRITIN, TIBC, IRON, RETICCTPCT in the last 72 hours. Urinalysis No results found for: COLORURINE, APPEARANCEUR, [place], [place], GLUCOSEU, [place], BILIRUBINUR, KETONESUR, PROTEINUR, UROBILINOGEN, NITRITE, LEUKOCYTESUR Sepsis Labs Invalid input(s): PROCALCITONIN,  WBC,  LACTICIDVEN Microbiology Recent Results (from the past  240 hour(s))  SARS Coronavirus 2 by RT PCR (hospital order, performed in [facility] hospital lab) Nasopharyngeal Nasopharyngeal Swab     Status: None   Collection Time: 02/17/20  4:36 PM   Specimen: Nasopharyngeal Swab  Result Value Ref Range Status   SARS Coronavirus 2 NEGATIVE NEGATIVE Final    Comment: (NOTE) SARS-CoV-2 target nucleic acids are NOT DETECTED.  The SARS-CoV-2 RNA is generally detectable in upper and lower respiratory specimens during the acute phase of infection. The lowest concentration of SARS-CoV-2 viral copies this assay can detect is 250 copies / mL. A negative result does not preclude SARS-CoV-2 infection and should not be used as the sole basis for treatment or other patient management decisions.  A negative result may occur with improper specimen collection / handling, submission of specimen other than nasopharyngeal swab, presence of viral mutation(s) within the areas targeted by this assay, and inadequate number of viral copies (<250 copies / mL). A negative result must be combined with  clinical observations, patient history, and epidemiological information.  Fact Sheet for Patients:   StrictlyIdeas.no  Fact Sheet for Healthcare Providers: BankingDealers.co.za  This test is not yet approved or  cleared by the [place] FDA and has been authorized for detection and/or diagnosis of SARS-CoV-2 by FDA under an Emergency Use Authorization (EUA).  This EUA will remain in effect (meaning this test can be used) for the duration of the COVID-19 declaration under Section 564(b)(1) of the Act, 21 U.S.C. section 360bbb-3(b)(1), unless the authorization is terminated or revoked sooner.  Performed at [facility], [place] [address] [place], [place] [address]      Time coordinating discharge: 35 minutes  SIGNED:   Jonnie Finner, DO  [facility] 02/19/2020, 11:17 AM   If 7PM-7AM, please contact night-coverage [email or web address]

## 2020-02-19 NOTE — Progress Notes (Signed)
Chaplain had Advanced Directive notarized.  Anthony Carter was given the original copy and one additional copy.  A copy was also given to the unit administrator to input within his chart.   Chaplain will follow-up as needed.

## 2020-02-25 ENCOUNTER — Ambulatory Visit (INDEPENDENT_AMBULATORY_CARE_PROVIDER_SITE_OTHER): Payer: Medicare HMO

## 2020-02-25 DIAGNOSIS — I2699 Other pulmonary embolism without acute cor pulmonale: Secondary | ICD-10-CM | POA: Diagnosis not present

## 2020-02-25 DIAGNOSIS — I2609 Other pulmonary embolism with acute cor pulmonale: Secondary | ICD-10-CM

## 2020-02-25 DIAGNOSIS — I4891 Unspecified atrial fibrillation: Secondary | ICD-10-CM

## 2020-03-10 ENCOUNTER — Telehealth: Payer: Self-pay

## 2020-03-10 NOTE — Telephone Encounter (Signed)
Received [employer] monitor results that patient was having episode of sinus tachycardia with 150 bpm.  I contacted patient and he states that around that time yesterday he was outdoors doing yard work, and he went back inside after that- he was not having any symptoms, no chest pain, sob, or palpitations.  Patient appreciative of the call back-  Will route as patient is now going to be seeing the [place] office, and will make sure that Dr. Gasper Sells receives this before the visit on 09/15.

## 2020-03-26 ENCOUNTER — Other Ambulatory Visit: Payer: Self-pay

## 2020-03-26 ENCOUNTER — Encounter: Payer: Self-pay | Admitting: Internal Medicine

## 2020-03-26 ENCOUNTER — Ambulatory Visit: Payer: Medicare HMO | Admitting: Internal Medicine

## 2020-03-26 VITALS — BP 110/60 | HR 78 | Ht 69.0 in | Wt 171.8 lb

## 2020-03-26 DIAGNOSIS — I639 Cerebral infarction, unspecified: Secondary | ICD-10-CM

## 2020-03-26 DIAGNOSIS — I739 Peripheral vascular disease, unspecified: Secondary | ICD-10-CM

## 2020-03-26 DIAGNOSIS — E782 Mixed hyperlipidemia: Secondary | ICD-10-CM

## 2020-03-26 DIAGNOSIS — I2782 Chronic pulmonary embolism: Secondary | ICD-10-CM

## 2020-03-26 DIAGNOSIS — R002 Palpitations: Secondary | ICD-10-CM | POA: Diagnosis not present

## 2020-03-26 MED ORDER — ATORVASTATIN CALCIUM 10 MG PO TABS
10.0000 mg | ORAL_TABLET | Freq: Every day | ORAL | 3 refills | Status: DC
Start: 2020-03-26 — End: 2020-03-26

## 2020-03-26 MED ORDER — ATORVASTATIN CALCIUM 10 MG PO TABS: 10.0000 mg | ORAL_TABLET | Freq: Every day | ORAL | 3 refills | Status: DC

## 2020-03-26 NOTE — Progress Notes (Signed)
Cardiology Office Note:    Date:  03/26/2020   ID:  Anthony Carter, DOB December 31, 1950, MRN [id number]  PCP:  Mayra Neer, MD  [facility] Cardiologist:  No primary care provider on file.  [facility] Electrophysiologist:  None   Referring MD: Wenda Low, MD   [id number] up Heart Rhythm Monitor Consulted for the evaluation of irregular heart rhythm at the behest of Mayra Neer, MD   History of Present Illness:    Anthony Carter is a 69 y.o. male with a hx of recent admission for PE (02/2020), GERD, prior CVA with residual deficit (2016; unclear deficit prior), HLD (LDL 100 on no medication; OSH notes Lipitor allergy of joint pain thought patient denies)  Since discharge, patient has noted that he is doing well. Notes no bleeding bruising.  Has been doing well after leaving the hospital.  Able to garden and do all ADLs without issues.  Retired.  No syncope, near syncope, no chest pain, shortness of breath, PND, orthopnea.  During [employer] triggers patient notes that 02/25/20 was doing yard work (sinus tachycardia).  Working on the bushes (he thinks) 02/29/20. Felt extra tired with a weight on his chest during the hospital heart rate irregularity.    Notes that he has leg pain with exertion.  Worsen standing up or walking up a hill.  Possible leg pain with letting his legs hang down; no rest pain. Biltareal leg pain, with R foot tingling.    Past Medical History:  Diagnosis Date  . CVA (cerebral vascular accident) ([place]) 2005   Residual right-sided weakness  . Diabetes ([place])   . HLD (hyperlipidemia)     No past surgical history on file.  Current Medications: No outpatient medications have been marked as taking for the 03/26/20 encounter (Office Visit) with Werner Lean, MD.    Allergies:   Codeine   Social History   Socioeconomic History  . Marital status: Married    Spouse name: Not on file  . Number of children: Not on file  . Years of education: Not  on file  . Highest education level: Not on file  Occupational History  . Not on file  Tobacco Use  . Smoking status: Never Smoker  . Smokeless tobacco: Never Used  Substance and Sexual Activity  . Alcohol use: Not Currently  . Drug use: Never  . Sexual activity: Not Currently  Other Topics Concern  . Not on file  Social History Narrative  . Not on file   Social Determinants of Health   Financial Resource Strain:   . Difficulty of Paying Living Expenses: Not on file  Food Insecurity:   . Worried About Charity fundraiser in the Last Year: Not on file  . Ran Out of Food in the Last Year: Not on file  Transportation Needs:   . Lack of Transportation (Medical): Not on file  . Lack of Transportation (Non-Medical): Not on file  Physical Activity:   . Days of Exercise per Week: Not on file  . Minutes of Exercise per Session: Not on file  Stress:   . Feeling of Stress : Not on file  Social Connections:   . Frequency of Communication with Friends and Family: Not on file  . Frequency of Social Gatherings with Friends and Family: Not on file  . Attends Religious Services: Not on file  . Active Member of Clubs or Organizations: Not on file  . Attends Archivist Meetings: Not on file  .  Marital Status: Not on file     Family History: The patient's family history includes Diabetes in his father and mother; Hyperlipidemia in his brother, father, and mother.  ROS:   Please see the history of present illness.    All other systems reviewed and are negative.  EKGs/Labs/Other Studies Reviewed:    The following studies were personally reviewed today:  EKG:   02/18/20:  Sinus Tachycardia HR 100, with LVH.  TTE 02/18/20:  Normal biventricular function without valvular regurgitation CT Angio Chest 02/17/20:  RV enlargement, R sided PE, bilateral scattered effusion, no significant CAD   Recent Labs: 11/08/2019: TSH 2.480 02/17/2020: ALT 17; B Natriuretic Peptide 38.6 02/19/2020:  BUN 12; Creatinine, Ser 1.13; Hemoglobin 12.2; Magnesium 2.0; Platelets 183; Potassium 3.6; Sodium 132   [employer] Monitor Events and Summaryt: Query of atrial fibrillation as the indication Sinus tachycardia to sinus rhythm rates 60-150s Patient called at 150s and was outside doing yard work at the time (02/25/20, 02/29/20)  Recent Lipid Panel    Component Value Date/Time   CHOL 164 11/08/2019 0906   TRIG 119 11/08/2019 0906   HDL 42 11/08/2019 0906   CHOLHDL 3.9 11/08/2019 0906   LDLCALC 100 (H) 11/08/2019 0906    Physical Exam:    VS:  Ht 5\' 9"  (1.753 m)   Wt 171 lb 12.8 oz (77.9 kg)   BMI 25.37 kg/m     Wt Readings from Last 3 Encounters:  03/26/20 171 lb 12.8 oz (77.9 kg)  02/19/20 171 lb (77.6 kg)  11/07/19 176 lb (79.8 kg)    GEN: Elderly male, Well nourished, well developed in no acute distress HEENT: Normal NECK: No JVD; No carotid bruits LYMPHATICS: No lymphadenopathy CARDIAC: RRR, no murmurs, rubs, gallops, no RV heave; palpable pulses in legs RESPIRATORY:  Clear to auscultation without rales, wheezing or rhonchi  ABDOMEN: Soft, non-tender, non-distended MUSCULOSKELETAL:  No edema; No deformity  SKIN: Warm and dry NEUROLOGIC:  Alert and oriented x 3 PSYCHIATRIC:  Normal affect   ASSESSMENT:    1. Chronic pulmonary embolism without acute cor pulmonale, unspecified pulmonary embolism type (HCC)   2. Palpitations   3. Mixed hyperlipidemia   4. Cerebrovascular accident (CVA), unspecified mechanism ([place])    PLAN:    In order of problems listed above:  1. Pulmonary Embolism with Tachycardia - on appropriate dose of DOAC, sinus tachycardia likely related to recovery from illness; lead diagnosis is 8 hour drives, but no clear culprit - Discussed education on return of sudden onset fatigue and heaviness again for evaluation of arrthymia 2. HLD with prior CVA and possible claudication (chart review)  SHARED DECISION MAKING: discussed risk and benefits of  moderate dose statin attempt; amenable to try and eval for myalgias. Starting atorvastatin 10, will leave aspirin presently (low threshold to stop for bleeding problems) and do ABIs (reviewed past records without evidence of DVT).  7-8 months   Medication Adjustments/Labs and Tests Ordered: Current medicines are reviewed at length with the patient today.  Concerns regarding medicines are outlined above.  No orders of the defined types were placed in this encounter.  No orders of the defined types were placed in this encounter.  There are no Patient Instructions on file for this visit.   Signed, Werner Lean, MD  03/26/2020 11:04 AM    [place]

## 2020-03-26 NOTE — Patient Instructions (Signed)
Medication Instructions:  Your physician has recommended you make the following change in your medication:   START Atorvastatin 10mg  Daily  *If you need a refill on your cardiac medications before your next appointment, please call your pharmacy*   Lab Work: None  If you have labs (blood work) drawn today and your tests are completely normal, you will receive your results only by: Marland Kitchen MyChart Message (if you have MyChart) OR . A paper copy in the mail If you have any lab test that is abnormal or we need to change your treatment, we will call you to review the results.   Testing/Procedures: Your physician has requested that you have an ankle brachial index (ABI). During this test an ultrasound and blood pressure cuff are used to evaluate the arteries that supply the arms and legs with blood. Allow thirty minutes for this exam. There are no restrictions or special instructions.     Follow-Up: At [facility], you and your health needs are our priority.  As part of our continuing mission to provide you with exceptional heart care, we have created designated Provider Care Teams.  These Care Teams include your primary Cardiologist (physician) and Advanced Practice Providers (APPs -  Physician Assistants and Nurse Practitioners) who all work together to provide you with the care you need, when you need it.  We recommend signing up for the patient portal called "MyChart".  Sign up information is provided on this After Visit Summary.  MyChart is used to connect with patients for Virtual Visits (Telemedicine).  Patients are able to view lab/test results, encounter notes, upcoming appointments, etc.  Non-urgent messages can be sent to your provider as well.   To learn more about what you can do with MyChart, go to [email or web address].    Your next appointment:   7 month(s)  The format for your next appointment:   In Person  Provider:   Renee Harder, MD    Other  Instructions None

## 2020-03-27 ENCOUNTER — Other Ambulatory Visit: Payer: Self-pay | Admitting: Medical

## 2020-03-27 DIAGNOSIS — I2699 Other pulmonary embolism without acute cor pulmonale: Secondary | ICD-10-CM

## 2020-03-27 DIAGNOSIS — I4891 Unspecified atrial fibrillation: Secondary | ICD-10-CM

## 2020-03-27 DIAGNOSIS — I2609 Other pulmonary embolism with acute cor pulmonale: Secondary | ICD-10-CM

## 2020-04-23 ENCOUNTER — Other Ambulatory Visit: Payer: Self-pay

## 2020-04-23 ENCOUNTER — Ambulatory Visit (HOSPITAL_COMMUNITY)
Admission: RE | Admit: 2020-04-23 | Discharge: 2020-04-23 | Disposition: A | Discharge status: Home / Self Care | Payer: Medicare HMO | Source: Ambulatory Visit | Attending: Cardiology | Admitting: Cardiology

## 2020-04-23 ENCOUNTER — Encounter (HOSPITAL_COMMUNITY): Payer: Medicare HMO

## 2020-04-23 DIAGNOSIS — I739 Peripheral vascular disease, unspecified: Secondary | ICD-10-CM

## 2020-04-23 DIAGNOSIS — E782 Mixed hyperlipidemia: Secondary | ICD-10-CM

## 2020-04-28 ENCOUNTER — Telehealth: Payer: Self-pay

## 2020-04-28 NOTE — Telephone Encounter (Signed)
-----   Message from Werner Lean, MD sent at 04/27/2020 12:14 PM EDT ----- Results: Right TBI abnormal, disease, mild with biphasic pulses Plan: Continue current therapy; if leg pains persists or worsens would offer cardiovascular rehabilitation  Werner Lean, MD

## 2020-04-28 NOTE — Telephone Encounter (Signed)
Attempted to call patient with results, no answer and unable to leave a message at this time due to voicemailbox being full.

## 2020-05-01 DIAGNOSIS — I69951 Hemiplegia and hemiparesis following unspecified cerebrovascular disease affecting right dominant side: Secondary | ICD-10-CM | POA: Diagnosis not present

## 2020-05-01 DIAGNOSIS — I2699 Other pulmonary embolism without acute cor pulmonale: Secondary | ICD-10-CM | POA: Diagnosis not present

## 2020-05-01 DIAGNOSIS — N400 Enlarged prostate without lower urinary tract symptoms: Secondary | ICD-10-CM | POA: Diagnosis not present

## 2020-05-01 DIAGNOSIS — Z0000 Encounter for general adult medical examination without abnormal findings: Secondary | ICD-10-CM | POA: Diagnosis not present

## 2020-05-01 DIAGNOSIS — Z23 Encounter for immunization: Secondary | ICD-10-CM | POA: Diagnosis not present

## 2020-05-01 DIAGNOSIS — E782 Mixed hyperlipidemia: Secondary | ICD-10-CM | POA: Diagnosis not present

## 2020-05-01 DIAGNOSIS — N529 Male erectile dysfunction, unspecified: Secondary | ICD-10-CM | POA: Diagnosis not present

## 2020-05-01 DIAGNOSIS — Z8673 Personal history of transient ischemic attack (TIA), and cerebral infarction without residual deficits: Secondary | ICD-10-CM | POA: Diagnosis not present

## 2020-05-01 DIAGNOSIS — G8190 Hemiplegia, unspecified affecting unspecified side: Secondary | ICD-10-CM | POA: Diagnosis not present

## 2020-05-01 DIAGNOSIS — R7309 Other abnormal glucose: Secondary | ICD-10-CM | POA: Diagnosis not present

## 2020-09-25 ENCOUNTER — Other Ambulatory Visit: Payer: Self-pay | Admitting: Physician Assistant

## 2020-09-25 DIAGNOSIS — R7303 Prediabetes: Secondary | ICD-10-CM

## 2020-10-09 DIAGNOSIS — R69 Illness, unspecified: Secondary | ICD-10-CM | POA: Diagnosis not present

## 2020-10-27 ENCOUNTER — Other Ambulatory Visit: Payer: Self-pay | Admitting: Internal Medicine

## 2020-10-27 DIAGNOSIS — R7303 Prediabetes: Secondary | ICD-10-CM | POA: Diagnosis not present

## 2020-10-27 DIAGNOSIS — Z8673 Personal history of transient ischemic attack (TIA), and cerebral infarction without residual deficits: Secondary | ICD-10-CM | POA: Diagnosis not present

## 2020-10-27 DIAGNOSIS — I739 Peripheral vascular disease, unspecified: Secondary | ICD-10-CM | POA: Diagnosis not present

## 2020-10-27 DIAGNOSIS — I2699 Other pulmonary embolism without acute cor pulmonale: Secondary | ICD-10-CM

## 2020-10-27 DIAGNOSIS — E782 Mixed hyperlipidemia: Secondary | ICD-10-CM | POA: Diagnosis not present

## 2020-10-27 DIAGNOSIS — G8190 Hemiplegia, unspecified affecting unspecified side: Secondary | ICD-10-CM | POA: Diagnosis not present

## 2020-10-30 ENCOUNTER — Telehealth: Payer: Self-pay | Admitting: Physician Assistant

## 2020-10-30 NOTE — Telephone Encounter (Signed)
Received a new hem referral from Dr. Lysle Rubens for hx of pe. Anthony Carter returned my call and has been scheduled to see [name] on 4/26 at 11am. Pt aware to arrive 20 minutes early.

## 2020-11-04 ENCOUNTER — Other Ambulatory Visit: Payer: Self-pay

## 2020-11-04 ENCOUNTER — Inpatient Hospital Stay: Payer: Medicare HMO

## 2020-11-04 ENCOUNTER — Encounter: Payer: Self-pay | Admitting: Physician Assistant

## 2020-11-04 ENCOUNTER — Inpatient Hospital Stay: Payer: Medicare HMO | Attending: Physician Assistant | Admitting: Physician Assistant

## 2020-11-04 VITALS — BP 108/71 | HR 75 | Temp 97.8°F | Resp 19 | Ht 69.0 in | Wt 172.3 lb

## 2020-11-04 DIAGNOSIS — R5383 Other fatigue: Secondary | ICD-10-CM | POA: Insufficient documentation

## 2020-11-04 DIAGNOSIS — Z86711 Personal history of pulmonary embolism: Secondary | ICD-10-CM | POA: Diagnosis not present

## 2020-11-04 DIAGNOSIS — Z7984 Long term (current) use of oral hypoglycemic drugs: Secondary | ICD-10-CM | POA: Insufficient documentation

## 2020-11-04 DIAGNOSIS — Z7901 Long term (current) use of anticoagulants: Secondary | ICD-10-CM | POA: Diagnosis not present

## 2020-11-04 DIAGNOSIS — E119 Type 2 diabetes mellitus without complications: Secondary | ICD-10-CM | POA: Insufficient documentation

## 2020-11-04 DIAGNOSIS — K5909 Other constipation: Secondary | ICD-10-CM | POA: Insufficient documentation

## 2020-11-04 DIAGNOSIS — Z7982 Long term (current) use of aspirin: Secondary | ICD-10-CM | POA: Insufficient documentation

## 2020-11-04 DIAGNOSIS — Z86718 Personal history of other venous thrombosis and embolism: Secondary | ICD-10-CM

## 2020-11-04 DIAGNOSIS — Z79899 Other long term (current) drug therapy: Secondary | ICD-10-CM | POA: Diagnosis not present

## 2020-11-04 DIAGNOSIS — I2699 Other pulmonary embolism without acute cor pulmonale: Secondary | ICD-10-CM | POA: Insufficient documentation

## 2020-11-04 LAB — CBC WITH DIFFERENTIAL (CANCER CENTER ONLY)
Abs Immature Granulocytes: 0 10*3/uL (ref 0.00–0.07)
Basophils Absolute: 0 10*3/uL (ref 0.0–0.1)
Basophils Relative: 1 %
Eosinophils Absolute: 0.3 10*3/uL (ref 0.0–0.5)
Eosinophils Relative: 5 %
HCT: 41.5 % (ref 39.0–52.0)
Hemoglobin: 13.8 g/dL (ref 13.0–17.0)
Immature Granulocytes: 0 %
Lymphocytes Relative: 41 %
Lymphs Abs: 2.1 10*3/uL (ref 0.7–4.0)
MCH: 27.9 pg (ref 26.0–34.0)
MCHC: 33.3 g/dL (ref 30.0–36.0)
MCV: 84 fL (ref 80.0–100.0)
Monocytes Absolute: 0.4 10*3/uL (ref 0.1–1.0)
Monocytes Relative: 8 %
Neutro Abs: 2.4 10*3/uL (ref 1.7–7.7)
Neutrophils Relative %: 45 %
Platelet Count: 213 10*3/uL (ref 150–400)
RBC: 4.94 MIL/uL (ref 4.22–5.81)
RDW: 13.8 % (ref 11.5–15.5)
WBC Count: 5.1 10*3/uL (ref 4.0–10.5)
nRBC: 0 % (ref 0.0–0.2)

## 2020-11-04 LAB — CMP (CANCER CENTER ONLY)
ALT: 20 U/L (ref 0–44)
AST: 29 U/L (ref 15–41)
Albumin: 3.9 g/dL (ref 3.5–5.0)
Alkaline Phosphatase: 114 U/L (ref 38–126)
Anion gap: 8 (ref 5–15)
BUN: 12 mg/dL (ref 8–23)
CO2: 26 mmol/L (ref 22–32)
Calcium: 9.2 mg/dL (ref 8.9–10.3)
Chloride: 105 mmol/L (ref 98–111)
Creatinine: 1.11 mg/dL (ref 0.61–1.24)
GFR, Estimated: >60 mL/min (ref >60)
Glucose, Bld: 104 mg/dL — ABNORMAL HIGH (ref 70–99)
Potassium: 4.1 mmol/L (ref 3.5–5.1)
Sodium: 139 mmol/L (ref 135–145)
Total Bilirubin: 1.6 mg/dL — ABNORMAL HIGH (ref 0.3–1.2)
Total Protein: 8 g/dL (ref 6.5–8.1)

## 2020-11-04 NOTE — Progress Notes (Signed)
[place] [phone]   Fax:([phone] [place] NOTE  Patient Care Team: Wenda Low, MD as PCP - General (Internal Medicine) Werner Lean, MD as PCP - Cardiology (Cardiology)  Hematological/Oncological History 2005: History of CVA with mild residual right -sided weakness. Started daily aspirin.   August 2021: Presented with chest pain, back pain and left lower neck pain. CTA chest was obtained that revealed bilateral lower lobe PE with mild right ventricular heart strain. Overall clot burden moderate to severe. Echo was normal with EF of 60-65%. Treated with heparin and transitioned to eliquis and continue ASA.   11/04/2020: Establish care with Dede Query PA-C  CHIEF COMPLAINTS/PURPOSE OF CONSULTATION:  "Evaluation for anticoagulation "  HISTORY OF PRESENTING ILLNESS:  Zada Girt 70 y.o. male with medical history significant for anemia, diet, CVA in 2005 and bilateral pulmonary emboli right heart strain 2021.  Patient is unaccompanied for this visit.  On exam today, patient reports mild fatigue but continues stay active and complete all his ADLs on his own.  Patient denies any changes to his appetite or weight.  Patient denies any nausea, vomiting or abdominal pain.  He has chronic constipation with a bowel movement once or twice a week.  He recently started an over-the-counter Metamucil and fiber.  He denies any bruising or signs of bleeding including hematochezia, melena, hematuria, epistaxis, hemoptysis or gingival bleeding.  Patient reports right upper extremity pain that he rates 8 out of 10 on a pain scale.  Patient with doing a lot yard work yesterday and he is right-handed.Patient has chronic residual right-sided weakness from prior CVA in 2005.  Patient reports that he has noticed intermittent episodes of stumbling speech.  Patient denies any fevers, chills, shortness of breath, chest pain, cough, edema or neuropathy.  He  has no other complaints.  Rest of the 10 point ROS is below.  MEDICAL HISTORY:  Past Medical History:  Diagnosis Date  . CVA (cerebral vascular accident) ([place]) 2005   Residual right-sided weakness  . Diabetes ([place])   . HLD (hyperlipidemia)   . Pulmonary emboli ([place]) 2021    SURGICAL HISTORY: History reviewed. No pertinent surgical history.  SOCIAL HISTORY: Social History   Socioeconomic History  . Marital status: Married    Spouse name: Not on file  . Number of children: Not on file  . Years of education: Not on file  . Highest education level: Not on file  Occupational History  . Not on file  Tobacco Use  . Smoking status: Never Smoker  . Smokeless tobacco: Never Used  Substance and Sexual Activity  . Alcohol use: Yes    Comment: social use  . Drug use: Never  . Sexual activity: Not Currently  Other Topics Concern  . Not on file  Social History Narrative  . Not on file   Social Determinants of Health   Financial Resource Strain: Not on file  Food Insecurity: Not on file  Transportation Needs: Not on file  Physical Activity: Not on file  Stress: Not on file  Social Connections: Not on file  Intimate Partner Violence: Not on file    FAMILY HISTORY: Family History  Problem Relation Age of Onset  . Hyperlipidemia Mother   . Diabetes Mother   . Hyperlipidemia Father   . Diabetes Father   . Hyperlipidemia Brother     ALLERGIES:  is allergic to codeine.  MEDICATIONS:  Current Outpatient Medications  Medication Sig Dispense Refill  .  Accu-Chek Softclix Lancets lancets Use as instructed 100 each 12  . aspirin EC 81 MG tablet Take 81 mg by mouth daily after supper.     Marland Kitchen ELIQUIS 5 MG TABS tablet Take 1 tablet by mouth daily.    Marland Kitchen glucose blood (ACCU-CHEK AVIVA PLUS) test strip Use as instructed 100 each 12  . Multiple Vitamin (MULTIVITAMIN WITH MINERALS) TABS tablet Take 1 tablet by mouth daily after supper.    Marland Kitchen atorvastatin (LIPITOR) 10 MG tablet Take 1  tablet (10 mg total) by mouth daily. 90 tablet 3  . metFORMIN (GLUCOPHAGE) 500 MG tablet Take 1 tablet (500 mg total) by mouth daily with breakfast. 30 tablet 2   No current facility-administered medications for this visit.    REVIEW OF SYSTEMS:   Constitutional: ( - ) fevers, ( - )  chills , ( - ) night sweats Eyes: ( - ) blurriness of vision, ( - ) double vision, ( - ) watery eyes Ears, nose, mouth, throat, and face: ( - ) mucositis, ( - ) sore throat Respiratory: ( - ) cough, ( - ) dyspnea, ( - ) wheezes Cardiovascular: ( - ) palpitation, ( - ) chest discomfort, ( - ) lower extremity swelling Gastrointestinal:  ( - ) nausea, ( - ) heartburn, ( - ) change in bowel habits Skin: ( - ) abnormal skin rashes Lymphatics: ( - ) new lymphadenopathy, ( - ) easy bruising Neurological: ( - ) numbness, ( - ) tingling, ( - ) new weaknesses Behavioral/Psych: ( - ) mood change, ( - ) new changes  All other systems were reviewed with the patient and are negative.  PHYSICAL EXAMINATION: ECOG PERFORMANCE STATUS: 1 - Symptomatic but completely ambulatory  Vitals:   11/04/20 1106  BP: 108/71  Pulse: 75  Resp: 19  Temp: 97.8 F (36.6 C)  SpO2: 97%   Filed Weights   11/04/20 1106  Weight: 172 lb 4.8 oz (78.2 kg)    GENERAL: well appearing African American male in NAD  SKIN: skin color, texture, turgor are normal, no rashes or significant lesions EYES: conjunctiva are pink and non-injected, sclera clear OROPHARYNX: no exudate, no erythema; lips, buccal mucosa, and tongue normal  NECK: supple, non-tender LYMPH:  no palpable lymphadenopathy in the cervical, axillary or supraclavicular lymph nodes.  LUNGS: clear to auscultation and percussion with normal breathing effort HEART: regular rate & rhythm and no murmurs and no lower extremity edema ABDOMEN: soft, non-tender, non-distended, normal bowel sounds Musculoskeletal: no cyanosis of digits and no clubbing  PSYCH: alert & oriented x 3, fluent  speech NEURO: no focal motor/sensory deficits  LABORATORY DATA:  I have reviewed the data as listed CBC Latest Ref Rng & Units 11/04/2020 02/19/2020 02/18/2020  WBC 4.0 - 10.5 K/uL 5.1 6.8 9.8  Hemoglobin 13.0 - 17.0 g/dL 13.8 12.2(L) 12.7(L)  Hematocrit 39.0 - 52.0 % 41.5 35.6(L) 38.6(L)  Platelets 150 - 400 K/uL 213 183 169    CMP Latest Ref Rng & Units 11/04/2020 02/19/2020 02/18/2020  Glucose 70 - 99 mg/dL 104(H) 120(H) 113(H)  BUN 8 - 23 mg/dL 12 12 12   Creatinine 0.61 - 1.24 mg/dL 1.11 1.13 1.20  Sodium 135 - 145 mmol/L 139 132(L) 133(L)  Potassium 3.5 - 5.1 mmol/L 4.1 3.6 4.0  Chloride 98 - 111 mmol/L 105 99 97(L)  CO2 22 - 32 mmol/L 26 25 28   Calcium 8.9 - 10.3 mg/dL 9.2 8.5(L) 8.7(L)  Total Protein 6.5 - 8.1 g/dL 8.0 - -  Total Bilirubin 0.3 - 1.2 mg/dL 1.6(H) - -  Alkaline Phos 38 - 126 U/L 114 - -  AST 15 - 41 U/L 29 - -  ALT 0 - 44 U/L 20 - -    ASSESSMENT & PLAN HEARL HEIKES is a 70 y.o. male presenting to the clinic for evaluation of anticoagulation after presenting with bilateral PE in August 2021.  CTA chest on 02/17/2020 revealed bilateral lower lobe acute pulmonary emboli in the proximal segmental pulmonary arteries with evidence of right ventricular heart strain.  Overall clot burden was moderate to severe.  Patient was treated with heparin and then transition to Eliquis.  There is no definitive provoking factor that caused the pulmonary emboli although patient notes driving back and forth from [place] (average 4 hour drive).  Due to severity of the clot burden, the recommendation is indefinite anticoagulation.  We will obtain labs to rule out antiphospholipid antibody syndrome by checking cardiolipin antibodies, lupus anticoagulant panel and beta-2 microglobulin antibodies.On further questioning, patient admits he has been taking Eliquis once daily instead of the recommended dose of twice daily.  Advised patient to take Eliquis twice daily.  # History of bilateral  PE: --Currently on Eliquis QD instead of BID. Advised patient to take Eliquis BID.  --Recommend indefinite anticoagulation as there is no definitive provoking factor and moderate to severe clot burden. --Labs today to check CBC, CMP, cardiolipin antibodies, lupus anticoagulant panel and beta-2 microglobulin antibodies --RTC only if above workup requires additional intervention.   # Right Upper Extremity Pain: --Could be musculoskeletal as patient mentions recent yard work. Due to history of CVA, I advised patient to go to nearest ED if symptoms worsen or present with other neurological symptoms.   Orders Placed This Encounter  Procedures  . Beta-2-glycoprotein i abs, IgG/M/A    Standing Status:   Future    Number of Occurrences:   1    Standing Expiration Date:   11/04/2021  . Cardiolipin antibodies, IgG, IgM, IgA*    Standing Status:   Future    Number of Occurrences:   1    Standing Expiration Date:   11/04/2021  . Lupus anticoagulant panel*    Standing Status:   Future    Number of Occurrences:   1    Standing Expiration Date:   11/04/2021  . CBC with Differential ([facility] Center Only)    Standing Status:   Future    Number of Occurrences:   1    Standing Expiration Date:   11/04/2021  . CMP ([place] only)    Standing Status:   Future    Number of Occurrences:   1    Standing Expiration Date:   11/04/2021    All questions were answered. The patient knows to call the clinic with any problems, questions or concerns.  A total of more than 60 minutes were spent on this encounter and over half of that time was spent on counseling and coordination of care as outlined above.    Dede Query, PA-C Department of Hematology/Oncology [place] at [facility] Phone: [phone]  Patient was seen with Dr. Lorenso Courier.   I have read the above note and personally examined the patient. I agree with the assessment and plan as noted above.  Briefly General is a 70  year old male with medical history significant for CVA and recent bilateral PE who presents for evaluation. He is currently on eliquis therapy, but unfortunately is only taking  this once daily. Will increase to appropriate dose of BID. Recommend lifelong anticoagulation due to unprovoked nature of the clot. Additionally will r/o APS as this would change anticoagulation recommendations.    Ledell Peoples, MD Department of Hematology/Oncology [place] at [facility] Phone: [phone] Pager: [phone] Email: [email or web address]

## 2020-11-05 LAB — LUPUS ANTICOAGULANT PANEL
DRVVT: 43.6 s (ref 0.0–47.0)
PTT Lupus Anticoagulant: 31.8 s (ref 0.0–51.9)

## 2020-11-05 LAB — CARDIOLIPIN ANTIBODIES, IGG, IGM, IGA
Anticardiolipin IgA: <9 APL U/mL (ref 0–11)
Anticardiolipin IgG: <9 GPL U/mL (ref 0–14)
Anticardiolipin IgM: <9 MPL U/mL (ref 0–12)

## 2020-11-05 LAB — BETA-2-GLYCOPROTEIN I ABS, IGG/M/A
Beta-2 Glyco I IgG: <9 GPI IgG units (ref 0–20)
Beta-2-Glycoprotein I IgA: <9 GPI IgA units (ref 0–25)
Beta-2-Glycoprotein I IgM: <9 GPI IgM units (ref 0–32)

## 2020-11-10 ENCOUNTER — Telehealth: Payer: Self-pay | Admitting: *Deleted

## 2020-11-10 NOTE — Telephone Encounter (Signed)
Received call from patient regarding lab results from 11/04/20. He is asking for a call back from Dede Query, [place]

## 2020-11-11 ENCOUNTER — Other Ambulatory Visit: Payer: Self-pay

## 2020-11-11 ENCOUNTER — Ambulatory Visit
Admission: RE | Admit: 2020-11-11 | Discharge: 2020-11-11 | Disposition: A | Discharge status: Home / Self Care | Payer: Medicare HMO | Source: Ambulatory Visit | Attending: Internal Medicine | Admitting: Internal Medicine

## 2020-11-11 ENCOUNTER — Telehealth: Payer: Self-pay | Admitting: Physician Assistant

## 2020-11-11 DIAGNOSIS — R911 Solitary pulmonary nodule: Secondary | ICD-10-CM | POA: Diagnosis not present

## 2020-11-11 DIAGNOSIS — R0602 Shortness of breath: Secondary | ICD-10-CM | POA: Diagnosis not present

## 2020-11-11 DIAGNOSIS — R079 Chest pain, unspecified: Secondary | ICD-10-CM | POA: Diagnosis not present

## 2020-11-11 DIAGNOSIS — Z86711 Personal history of pulmonary embolism: Secondary | ICD-10-CM | POA: Diagnosis not present

## 2020-11-11 DIAGNOSIS — I2699 Other pulmonary embolism without acute cor pulmonale: Secondary | ICD-10-CM

## 2020-11-11 MED ORDER — IOPAMIDOL (ISOVUE-370) INJECTION 76%
75.0000 mL | Freq: Once | INTRAVENOUS | Status: AC | PRN
  Administered 2020-11-11: 75 mL via INTRAVENOUS

## 2020-11-11 NOTE — Telephone Encounter (Signed)
I called [name] and reviewed the labs results after our visit on 11/04/20. Antiphospholipid antibody panel was negative. The recommendation is to continue with Eliquis twice daily as prescribed. Due to unprovoked nature of the clot, recommend lifelong anticoagulation. Patient expressed understanding and satisfaction with the plan provided.

## 2021-01-21 DIAGNOSIS — L900 Lichen sclerosus et atrophicus: Secondary | ICD-10-CM | POA: Diagnosis not present

## 2021-01-21 DIAGNOSIS — R350 Frequency of micturition: Secondary | ICD-10-CM | POA: Diagnosis not present

## 2021-01-21 DIAGNOSIS — R3915 Urgency of urination: Secondary | ICD-10-CM | POA: Diagnosis not present

## 2021-01-21 DIAGNOSIS — N481 Balanitis: Secondary | ICD-10-CM | POA: Diagnosis not present

## 2021-01-21 DIAGNOSIS — N401 Enlarged prostate with lower urinary tract symptoms: Secondary | ICD-10-CM | POA: Diagnosis not present

## 2021-01-21 DIAGNOSIS — R3912 Poor urinary stream: Secondary | ICD-10-CM | POA: Diagnosis not present

## 2021-03-03 DIAGNOSIS — R350 Frequency of micturition: Secondary | ICD-10-CM | POA: Diagnosis not present

## 2021-03-03 DIAGNOSIS — N401 Enlarged prostate with lower urinary tract symptoms: Secondary | ICD-10-CM | POA: Diagnosis not present

## 2021-03-03 DIAGNOSIS — R3915 Urgency of urination: Secondary | ICD-10-CM | POA: Diagnosis not present

## 2021-04-30 DIAGNOSIS — R7303 Prediabetes: Secondary | ICD-10-CM | POA: Diagnosis not present

## 2021-04-30 DIAGNOSIS — Z8673 Personal history of transient ischemic attack (TIA), and cerebral infarction without residual deficits: Secondary | ICD-10-CM | POA: Diagnosis not present

## 2021-04-30 DIAGNOSIS — D649 Anemia, unspecified: Secondary | ICD-10-CM | POA: Diagnosis not present

## 2021-04-30 DIAGNOSIS — G8190 Hemiplegia, unspecified affecting unspecified side: Secondary | ICD-10-CM | POA: Diagnosis not present

## 2021-04-30 DIAGNOSIS — I739 Peripheral vascular disease, unspecified: Secondary | ICD-10-CM | POA: Diagnosis not present

## 2021-04-30 DIAGNOSIS — N400 Enlarged prostate without lower urinary tract symptoms: Secondary | ICD-10-CM | POA: Diagnosis not present

## 2021-04-30 DIAGNOSIS — E782 Mixed hyperlipidemia: Secondary | ICD-10-CM | POA: Diagnosis not present

## 2021-04-30 DIAGNOSIS — Z1389 Encounter for screening for other disorder: Secondary | ICD-10-CM | POA: Diagnosis not present

## 2021-04-30 DIAGNOSIS — Z0000 Encounter for general adult medical examination without abnormal findings: Secondary | ICD-10-CM | POA: Diagnosis not present

## 2021-04-30 DIAGNOSIS — I471 Supraventricular tachycardia: Secondary | ICD-10-CM | POA: Diagnosis not present

## 2021-04-30 DIAGNOSIS — I2699 Other pulmonary embolism without acute cor pulmonale: Secondary | ICD-10-CM | POA: Diagnosis not present

## 2021-05-20 IMAGING — CT CT ANGIO CHEST
1 of 2 series · 19 of 32 positions shown · IV contrast (APPLIED)
Comparison: Prior CTA of the chest on 02/17/2020

CLINICAL DATA: Shortness of breath and mid chest pain with history
of bilateral acute pulmonary embolism last [REDACTED].

EXAM:
CT ANGIOGRAPHY CHEST WITH CONTRAST
TECHNIQUE: Multidetector CT imaging of the chest was performed using the
standard protocol during bolus administration of intravenous
contrast. Multiplanar CT image reconstructions and MIPs were
obtained to evaluate the vascular anatomy.
CONTRAST:  75mL [id number] IOPAMIDOL (S291IE-PNK) INJECTION 76%

[Series 9: thins 1.0 b31s · axial · 0.76mm/px · z∈[-352,-61]mm · 19 of 325 slices shown]
[im 17/325  lung]
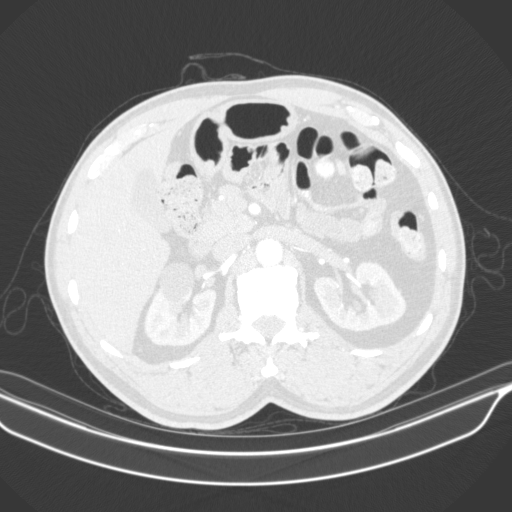
[im 33/325  mediastinal]
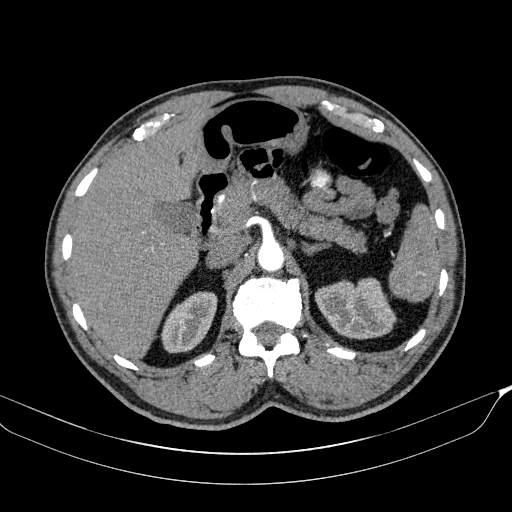
[im 49/325  lung]
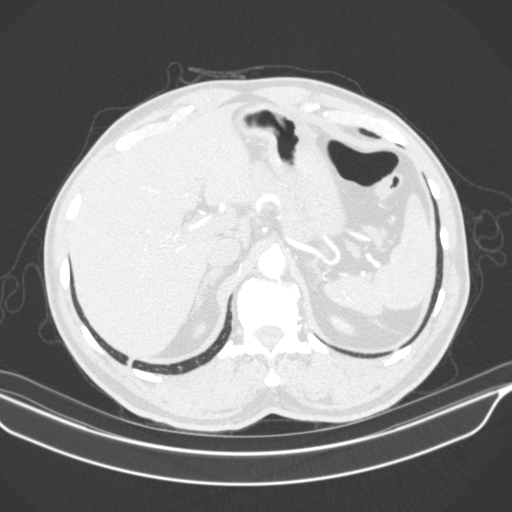
[im 82/325  mediastinal]
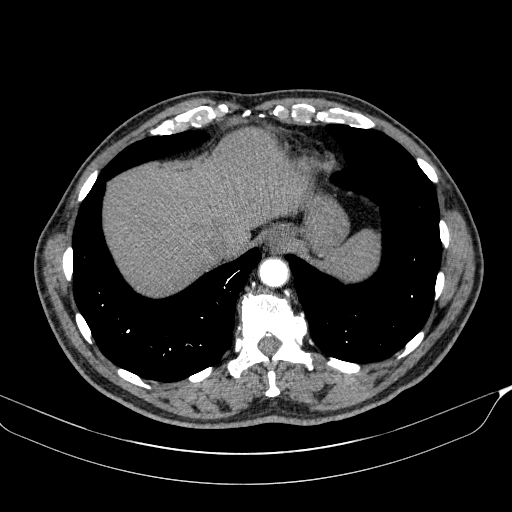
[im 98/325  lung]
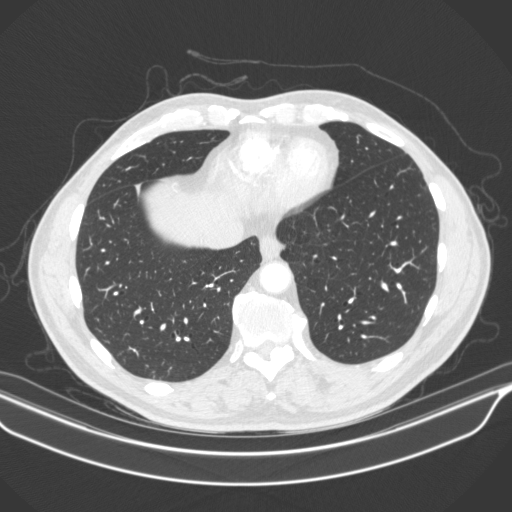
[im 109/325  mediastinal]
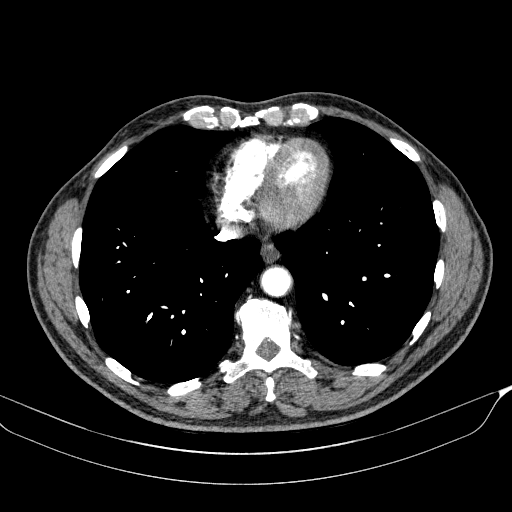
[im 114/325  lung]
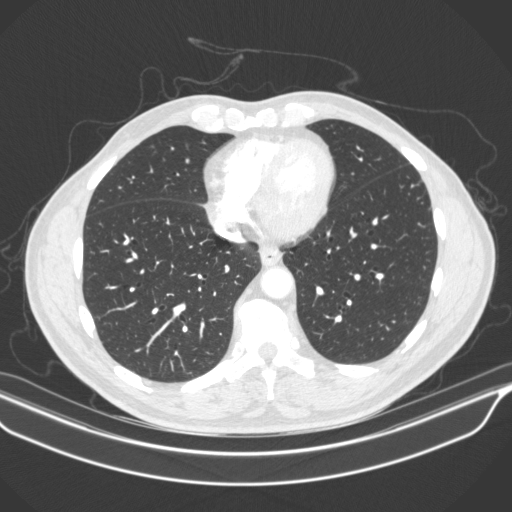
[im 130/325  mediastinal]
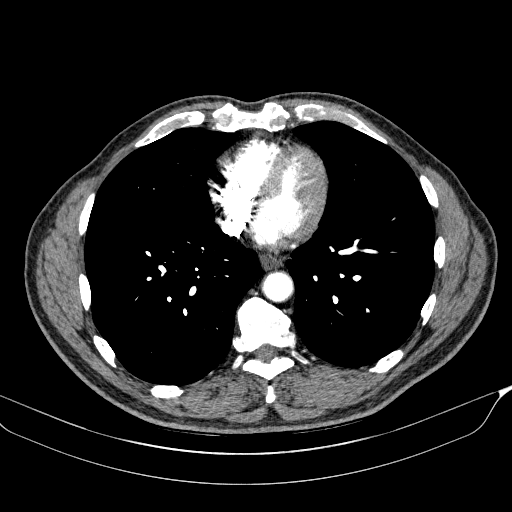
[im 146/325  lung]
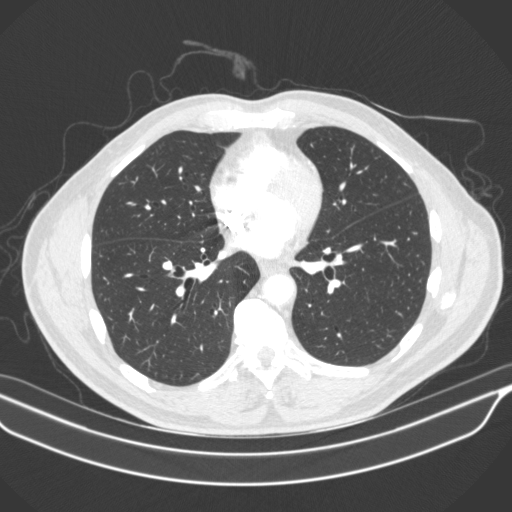
[im 163/325  mediastinal]
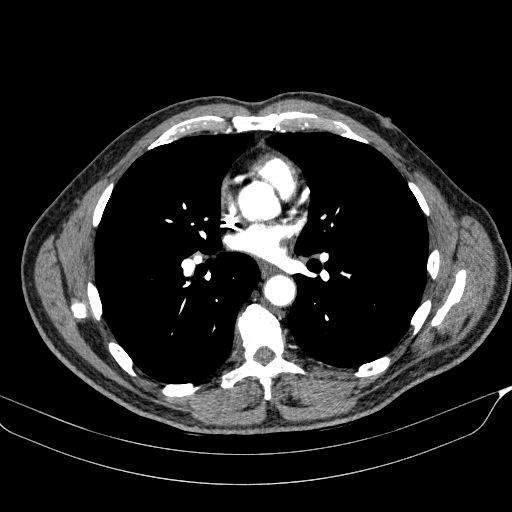
[im 179/325  lung]
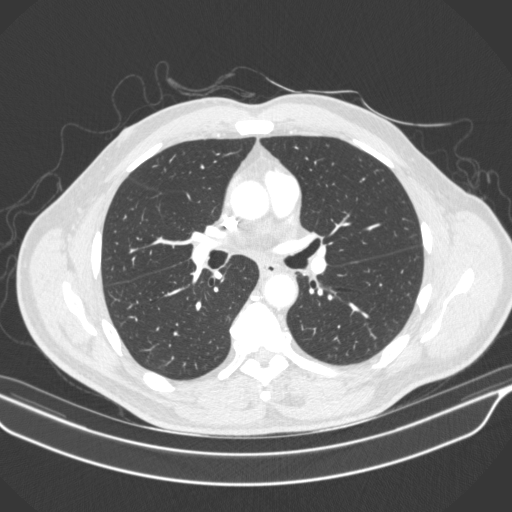
[im 195/325  mediastinal]
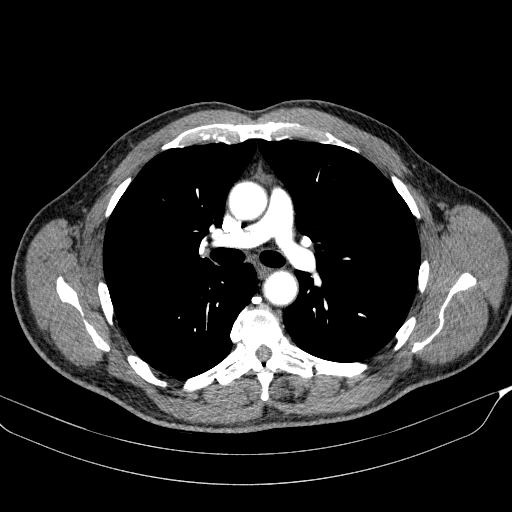
[im 211/325  lung]
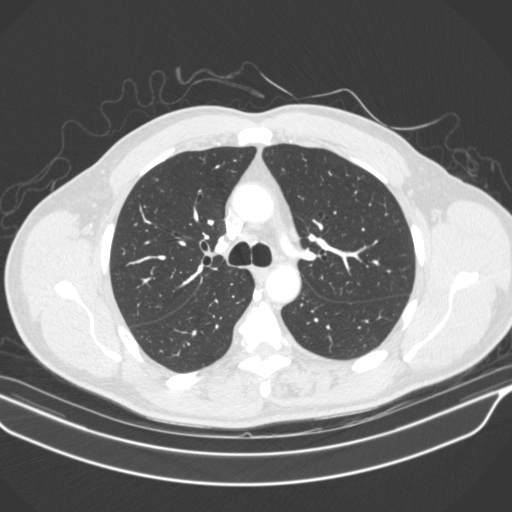
[im 217/325  mediastinal]
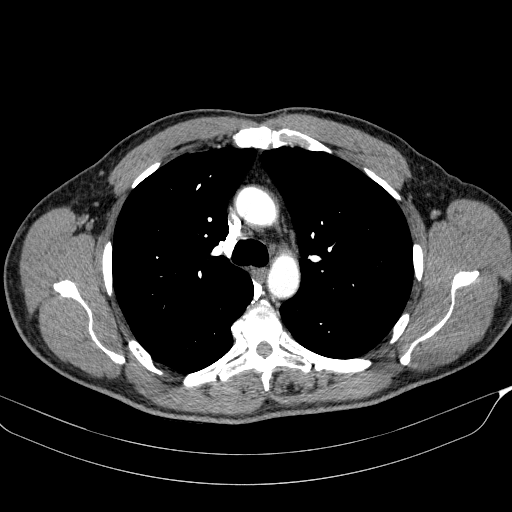
[im 227/325  lung]
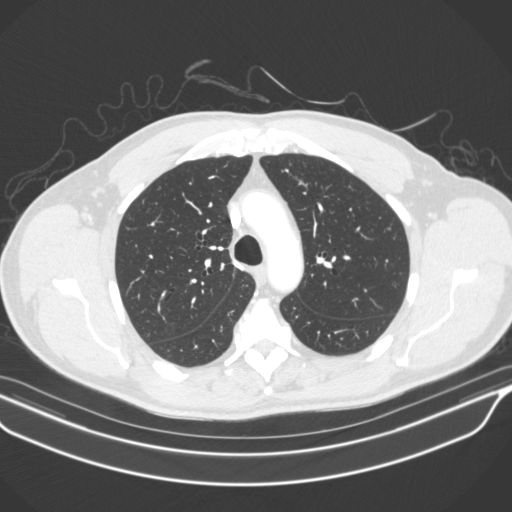
[im 244/325  mediastinal]
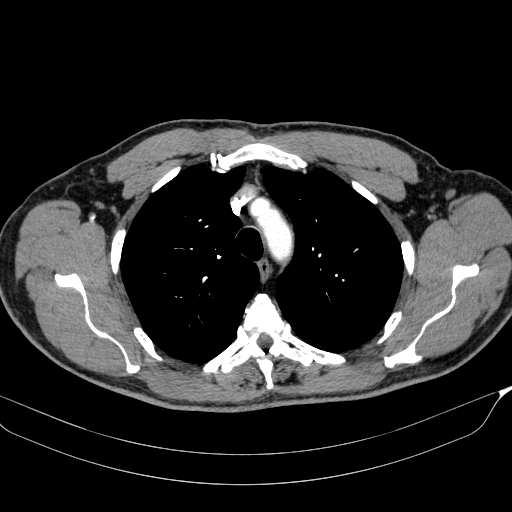
[im 276/325  lung]
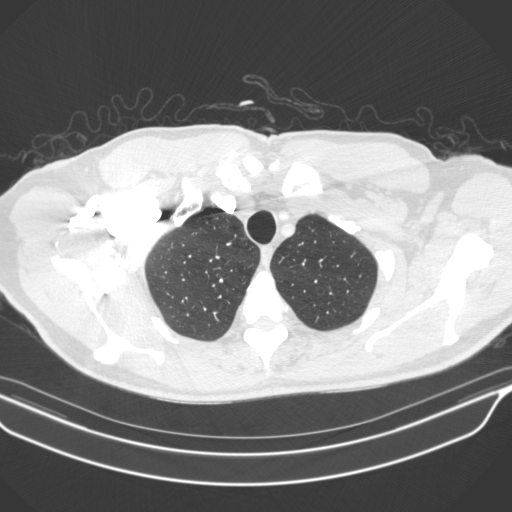
[im 292/325  mediastinal]
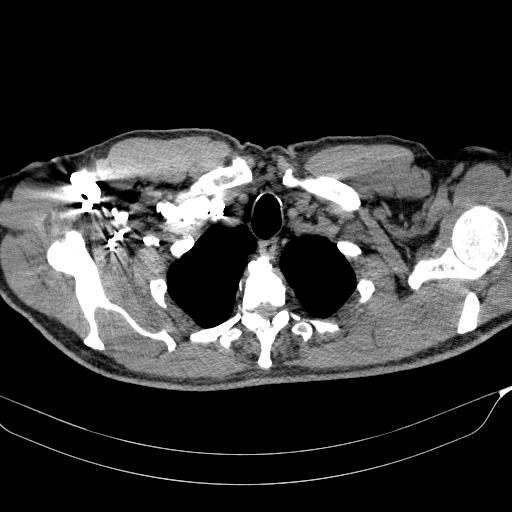
[im 308/325  lung]
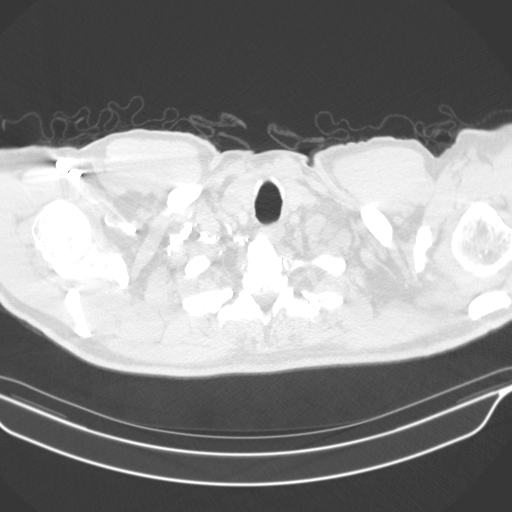

[19 of 32 positions shown; findings below may reference images not displayed]

FINDINGS: Cardiovascular: Previously noted bilateral pulmonary embolism has
completely resolved with no further thrombus identified. Central
pulmonary arteries are of normal caliber. The thoracic aorta is
normal. The heart size is normal. No evidence of pericardial fluid.
No visualized calcified coronary artery plaque.

Mediastinum/Nodes: No enlarged mediastinal, hilar, or axillary lymph
nodes. Thyroid gland, trachea, and esophagus demonstrate no
significant findings.

Lungs/Pleura: Stable 4 mm fissural nodule in the right major fissure
which was present in retrospect on the prior study but blurred by
respiratory motion. This is likely benign and represents either an
intrapulmonary lymph node or post inflammatory nodule. There is no
evidence of pulmonary edema, consolidation, pneumothorax or pleural
fluid.

Upper Abdomen: No acute abnormality.

Musculoskeletal: No chest wall abnormality. No acute or significant
osseous findings.

Review of the MIP images confirms the above findings.
IMPRESSION: 1. Complete resolution previously noted bilateral pulmonary
embolism.
2. No acute findings in the chest.
3. Stable 4 mm fissural nodule in the right major fissure which is
likely benign. No follow-up needed if patient is low-risk.
Non-contrast chest CT can be considered in 12 months if patient is
high-risk. This recommendation follows the consensus statement:
Guidelines for Management of Incidental Pulmonary Nodules Detected

## 2021-08-10 DIAGNOSIS — R69 Illness, unspecified: Secondary | ICD-10-CM | POA: Diagnosis not present

## 2021-10-27 DIAGNOSIS — G8190 Hemiplegia, unspecified affecting unspecified side: Secondary | ICD-10-CM | POA: Diagnosis not present

## 2021-10-27 DIAGNOSIS — R7303 Prediabetes: Secondary | ICD-10-CM | POA: Diagnosis not present

## 2021-10-27 DIAGNOSIS — I739 Peripheral vascular disease, unspecified: Secondary | ICD-10-CM | POA: Diagnosis not present

## 2021-10-27 DIAGNOSIS — Z86711 Personal history of pulmonary embolism: Secondary | ICD-10-CM | POA: Diagnosis not present

## 2021-10-27 DIAGNOSIS — N400 Enlarged prostate without lower urinary tract symptoms: Secondary | ICD-10-CM | POA: Diagnosis not present

## 2021-10-27 DIAGNOSIS — E782 Mixed hyperlipidemia: Secondary | ICD-10-CM | POA: Diagnosis not present

## 2022-01-27 DIAGNOSIS — H5200 Hypermetropia, unspecified eye: Secondary | ICD-10-CM | POA: Diagnosis not present

## 2022-01-27 DIAGNOSIS — E109 Type 1 diabetes mellitus without complications: Secondary | ICD-10-CM | POA: Diagnosis not present

## 2022-01-27 DIAGNOSIS — H25813 Combined forms of age-related cataract, bilateral: Secondary | ICD-10-CM | POA: Diagnosis not present

## 2022-01-27 DIAGNOSIS — Z0100 Encounter for examination of eyes and vision without abnormal findings: Secondary | ICD-10-CM | POA: Diagnosis not present

## 2022-02-01 NOTE — Progress Notes (Unsigned)
Cardiology Office Note:    Date:  02/03/2022   ID:  Anthony Carter, Anthony Carter Jan 04, 1951, MRN [id number]  PCP:  Wenda Low, MD  [facility] Cardiologist:  Werner Lean, MD  [place] Electrophysiologist:  None   Referring MD: Wenda Low, MD   CC: Follow up tachycardia  History of Present Illness:    Anthony Carter is a 71 y.o. male with a hx of recent admission for PE (02/2020), GERD, prior CVA with residual deficit (2016; unclear deficit prior), HLD (LDL 100 on no medication; OSH notes Lipitor allergy of joint pain thought patient denies) 2021: did not wish to progress with aggressive secondary prevention 2022: had another PE 11/2020 2023: Back on lipitor with no issues   Patient notes that he is doing pretty good most of the time.   Back in Lipitor with no issues. Is retired and is working around [employer].  No chest pain or pressure .  Rare dry cough this summer.  No SOB/DOE and no PND/Orthopnea.  No weight gain or leg swelling.  No palpitations or syncope.  Stopped eating pork or red meats.  Baked foods no fried foods ands started juicing.  No bleeding issues.    Past Medical History:  Diagnosis Date   CVA (cerebral vascular accident) ([place]) 2005   Residual right-sided weakness   Diabetes ([place])    HLD (hyperlipidemia)    Pulmonary emboli ([place]) 2021    No past surgical history on file.  Current Medications: Current Meds  Medication Sig   Accu-Chek Softclix Lancets lancets Use as instructed   atorvastatin (LIPITOR) 10 MG tablet Take 1 tablet (10 mg total) by mouth daily.   ELIQUIS 5 MG TABS tablet Take 1 tablet by mouth daily.   glucose blood (ACCU-CHEK AVIVA PLUS) test strip Use as instructed   ibuprofen (ADVIL) 800 MG tablet Take 800 mg by mouth as needed for pain.   metFORMIN (GLUCOPHAGE) 500 MG tablet Take 1 tablet (500 mg total) by mouth daily with breakfast.   Multiple Vitamin (MULTIVITAMIN WITH MINERALS) TABS tablet Take 1 tablet by mouth daily  after supper.   [DISCONTINUED] aspirin EC 81 MG tablet Take 81 mg by mouth daily after supper.     Allergies:   Codeine   Social History   Socioeconomic History   Marital status: Married    Spouse name: Not on file   Number of children: Not on file   Years of education: Not on file   Highest education level: Not on file  Occupational History   Not on file  Tobacco Use   Smoking status: Never   Smokeless tobacco: Never  Substance and Sexual Activity   Alcohol use: Yes    Comment: social use   Drug use: Never   Sexual activity: Not Currently  Other Topics Concern   Not on file  Social History Narrative   Not on file   Social Determinants of Health   Financial Resource Strain: Not on file  Food Insecurity: Not on file  Transportation Needs: Not on file  Physical Activity: Not on file  Stress: Not on file  Social Connections: Not on file     Family History: The patient's family history includes Diabetes in his father and mother; Hyperlipidemia in his brother, father, and mother.  ROS:   Please see the history of present illness.    All other systems reviewed and are negative.  EKGs/Labs/Other Studies Reviewed:    The following studies were personally  reviewed today:  EKG:   02/03/22: SR rate 70 1sst HB with septal infarct patter 02/18/20:  Sinus Tachycardia HR 100, with LVH.  Recent Labs: No results found for requested labs within last 365 days.    Recent Lipid Panel    Component Value Date/Time   CHOL 164 11/08/2019 0906   TRIG 119 11/08/2019 0906   HDL 42 11/08/2019 0906   CHOLHDL 3.9 11/08/2019 0906   LDLCALC 100 (H) 11/08/2019 0906    Physical Exam:    VS:  BP 128/68   Pulse 70   Ht '5\' 9"'$  (1.753 m)   Wt 167 lb (75.8 kg)   SpO2 99%   BMI 24.66 kg/m     Wt Readings from Last 3 Encounters:  02/03/22 167 lb (75.8 kg)  11/04/20 172 lb 4.8 oz (78.2 kg)  03/26/20 171 lb 12.8 oz (77.9 kg)    Gen: no distress   Neck: No JVD, Cardiac: No Rubs or  Gallops, no Murmur, RRR +2 radial pulses Respiratory: Clear to auscultation bilaterally, normal effort, normal  respiratory rate GI: Soft, nontender, non-distended  MS: No  edema;  moves all extremities Integument: Skin feels warm Neuro:  At time of evaluation, alert and oriented to person/place/time/situation  Psych: Normal affect, patient feels fell  ASSESSMENT:    1. Aortic atherosclerosis ([place])   2. Mixed hyperlipidemia   3. Coronary artery calcification   4. Chronic pulmonary embolism without acute cor pulmonale, unspecified pulmonary embolism type (HCC)     PLAN:     History of pulmonary embolism (unprovoked 2021 and 2022) - 1st degree HB - agree with PCP on DOAC; would stop ASA  HLD [place] atherosclerosis and LAD CAC on 2022 CTPE History of prior CVA No issues with statin - with new dietary changes, would check lipids and ALT - continue statin 10 for now; keep up the work on diet  Medication Adjustments/Labs and Tests Ordered: Current medicines are reviewed at length with the patient today.  Concerns regarding medicines are outlined above.  Orders Placed This Encounter  Procedures   Lipid panel   ALT   EKG 12-Lead   No orders of the defined types were placed in this encounter.  Patient Instructions  Medication Instructions:  Your physician has recommended you make the following change in your medication:  STOP: Aspirin  *If you need a refill on your cardiac medications before your next appointment, please call your pharmacy*   Lab Work: TODAY: FLP, ALT If you have labs (blood work) drawn today and your tests are completely normal, you will receive your results only by: [place] (if you have MyChart) OR A paper copy in the mail If you have any lab test that is abnormal or we need to change your treatment, we will call you to review the results.   Testing/Procedures: NONE   Follow-Up: At [facility], you and your health needs are our  priority.  As part of our continuing mission to provide you with exceptional heart care, we have created designated Provider Care Teams.  These Care Teams include your primary Cardiologist (physician) and Advanced Practice Providers (APPs -  Physician Assistants and Nurse Practitioners) who all work together to provide you with the care you need, when you need it.  We recommend signing up for the patient portal called "MyChart".  Sign up information is provided on this After Visit Summary.  MyChart is used to connect with patients for Virtual Visits (Telemedicine).  Patients are able to  view lab/test results, encounter notes, upcoming appointments, etc.  Non-urgent messages can be sent to your provider as well.   To learn more about what you can do with MyChart, go to [email or web address].    Your next appointment:   1 year(s)  The format for your next appointment:   In Person  Provider:   Werner Lean, MD    Important Information About Sugar         Signed, Werner Lean, MD  02/03/2022 10:01 AM    [place]

## 2022-02-03 ENCOUNTER — Encounter: Payer: Self-pay | Admitting: Internal Medicine

## 2022-02-03 ENCOUNTER — Ambulatory Visit: Payer: Medicare HMO | Admitting: Internal Medicine

## 2022-02-03 VITALS — BP 128/68 | HR 70 | Ht 69.0 in | Wt 167.0 lb

## 2022-02-03 DIAGNOSIS — I700 Atherosclerosis of aorta: Secondary | ICD-10-CM

## 2022-02-03 DIAGNOSIS — I2584 Coronary atherosclerosis due to calcified coronary lesion: Secondary | ICD-10-CM | POA: Diagnosis not present

## 2022-02-03 DIAGNOSIS — I2510 Atherosclerotic heart disease of native coronary artery without angina pectoris: Secondary | ICD-10-CM

## 2022-02-03 DIAGNOSIS — I2782 Chronic pulmonary embolism: Secondary | ICD-10-CM | POA: Diagnosis not present

## 2022-02-03 DIAGNOSIS — E782 Mixed hyperlipidemia: Secondary | ICD-10-CM | POA: Diagnosis not present

## 2022-02-03 LAB — LIPID PANEL
Chol/HDL Ratio: 4.6 ratio (ref 0.0–5.0)
Cholesterol, Total: 195 mg/dL (ref 100–199)
HDL: 42 mg/dL (ref >39)
LDL Chol Calc (NIH): 139 mg/dL — ABNORMAL HIGH (ref 0–99)
Triglycerides: 77 mg/dL (ref 0–149)
VLDL Cholesterol Cal: 14 mg/dL (ref 5–40)

## 2022-02-03 LAB — ALT: ALT: 35 IU/L (ref 0–44)

## 2022-02-03 NOTE — Patient Instructions (Signed)
Medication Instructions:  Your physician has recommended you make the following change in your medication:  STOP: Aspirin  *If you need a refill on your cardiac medications before your next appointment, please call your pharmacy*   Lab Work: TODAY: FLP, ALT If you have labs (blood work) drawn today and your tests are completely normal, you will receive your results only by: [place] (if you have MyChart) OR A paper copy in the mail If you have any lab test that is abnormal or we need to change your treatment, we will call you to review the results.   Testing/Procedures: NONE   Follow-Up: At [facility], you and your health needs are our priority.  As part of our continuing mission to provide you with exceptional heart care, we have created designated Provider Care Teams.  These Care Teams include your primary Cardiologist (physician) and Advanced Practice Providers (APPs -  Physician Assistants and Nurse Practitioners) who all work together to provide you with the care you need, when you need it.  We recommend signing up for the patient portal called "MyChart".  Sign up information is provided on this After Visit Summary.  MyChart is used to connect with patients for Virtual Visits (Telemedicine).  Patients are able to view lab/test results, encounter notes, upcoming appointments, etc.  Non-urgent messages can be sent to your provider as well.   To learn more about what you can do with MyChart, go to [email or web address].    Your next appointment:   1 year(s)  The format for your next appointment:   In Person  Provider:   Werner Lean, MD    Important Information About Sugar

## 2022-02-05 ENCOUNTER — Telehealth: Payer: Self-pay | Admitting: Internal Medicine

## 2022-02-05 DIAGNOSIS — Z79899 Other long term (current) drug therapy: Secondary | ICD-10-CM

## 2022-02-05 MED ORDER — ATORVASTATIN CALCIUM 20 MG PO TABS: 20.0000 mg | ORAL_TABLET | Freq: Every day | ORAL | 3 refills | Status: DC

## 2022-02-05 MED ORDER — ATORVASTATIN CALCIUM 20 MG PO TABS: 20.0000 mg | ORAL_TABLET | Freq: Every day | ORAL | 3 refills | Status: AC

## 2022-02-05 NOTE — Telephone Encounter (Signed)
Spoke with patient about results and need to increase Atorvastatin and do repeat labs. Pt requested new rx be sent to [facility]-sent now. Repeat labs ordered and scheduled for 05/06/22. Pt requested copies of labs to be mailed to him. I have printed these out and placed in mail room to be mailed. Patient also requesting we email him to remind him of upcoming lab appt, but he does not have MyChart. He states he usually receives email reminders. Will forward to primary RN to see if she manages this for him.

## 2022-02-05 NOTE — Telephone Encounter (Signed)
° °  Pt is returning call to get lab result °

## 2022-02-05 NOTE — Telephone Encounter (Signed)
Returned call to patient. Provided direct number for him to call. Order has been placed for repeat labs in 3 months and Atorvastatin '20mg'$  sent to pharmacy on file. Need to schedule lab appt for patient and make him aware of dose change.

## 2022-02-08 NOTE — Telephone Encounter (Signed)
Left a detailed message informing pt that we do not offer email appointment reminders. Appointment reminder placed in mail bin today.

## 2022-03-08 ENCOUNTER — Telehealth: Payer: Self-pay | Admitting: Internal Medicine

## 2022-03-08 NOTE — Telephone Encounter (Signed)
Patient would like for the nurse to give him a call. He stated he got a letter in the mail.

## 2022-03-09 NOTE — Telephone Encounter (Signed)
Received message call can't be completed as dialed. Wanted to let pt know letter sent in error. Pt medication change and f/u lab appointment already scheduled. No further needs from RN at this time.

## 2022-03-18 NOTE — Telephone Encounter (Signed)
Nurse does not need to speak with pt medication changes and lab appointment already scheduled.

## 2022-05-04 DIAGNOSIS — Z0000 Encounter for general adult medical examination without abnormal findings: Secondary | ICD-10-CM | POA: Diagnosis not present

## 2022-05-04 DIAGNOSIS — Z8673 Personal history of transient ischemic attack (TIA), and cerebral infarction without residual deficits: Secondary | ICD-10-CM | POA: Diagnosis not present

## 2022-05-04 DIAGNOSIS — E782 Mixed hyperlipidemia: Secondary | ICD-10-CM | POA: Diagnosis not present

## 2022-05-04 DIAGNOSIS — G8190 Hemiplegia, unspecified affecting unspecified side: Secondary | ICD-10-CM | POA: Diagnosis not present

## 2022-05-04 DIAGNOSIS — N400 Enlarged prostate without lower urinary tract symptoms: Secondary | ICD-10-CM | POA: Diagnosis not present

## 2022-05-04 DIAGNOSIS — N529 Male erectile dysfunction, unspecified: Secondary | ICD-10-CM | POA: Diagnosis not present

## 2022-05-04 DIAGNOSIS — Z1331 Encounter for screening for depression: Secondary | ICD-10-CM | POA: Diagnosis not present

## 2022-05-04 DIAGNOSIS — I739 Peripheral vascular disease, unspecified: Secondary | ICD-10-CM | POA: Diagnosis not present

## 2022-05-04 DIAGNOSIS — Z86711 Personal history of pulmonary embolism: Secondary | ICD-10-CM | POA: Diagnosis not present

## 2022-05-04 DIAGNOSIS — E1169 Type 2 diabetes mellitus with other specified complication: Secondary | ICD-10-CM | POA: Diagnosis not present

## 2022-05-06 ENCOUNTER — Ambulatory Visit: Payer: Medicare HMO | Attending: Internal Medicine

## 2022-05-06 DIAGNOSIS — Z79899 Other long term (current) drug therapy: Secondary | ICD-10-CM | POA: Diagnosis not present

## 2022-05-07 LAB — LIPID PANEL
Chol/HDL Ratio: 5.4 ratio — ABNORMAL HIGH (ref 0.0–5.0)
Cholesterol, Total: 222 mg/dL — ABNORMAL HIGH (ref 100–199)
HDL: 41 mg/dL (ref >39)
LDL Chol Calc (NIH): 145 mg/dL — ABNORMAL HIGH (ref 0–99)
Triglycerides: 196 mg/dL — ABNORMAL HIGH (ref 0–149)
VLDL Cholesterol Cal: 36 mg/dL (ref 5–40)

## 2022-05-07 LAB — ALT: ALT: 13 IU/L (ref 0–44)

## 2022-05-12 ENCOUNTER — Telehealth: Payer: Self-pay | Admitting: Internal Medicine

## 2022-05-12 DIAGNOSIS — I2510 Atherosclerotic heart disease of native coronary artery without angina pectoris: Secondary | ICD-10-CM

## 2022-05-12 DIAGNOSIS — E782 Mixed hyperlipidemia: Secondary | ICD-10-CM

## 2022-05-12 DIAGNOSIS — I700 Atherosclerosis of aorta: Secondary | ICD-10-CM

## 2022-05-12 NOTE — Telephone Encounter (Signed)
Pt returning nurses call regarding lab results. Please advise 

## 2022-05-12 NOTE — Telephone Encounter (Signed)
Pt called and discussed Lipid panel in great detail, and shared Dr. Oralia Rud concerns / note.    Pt has not been taking his atorvastatin.  This explains the change in the Lipid panel, and why it is so drastically different.    Pt states that he has soreness in his lower extremities.  However, he understood the importance of taking atorvastatin.... and how high levels of fat in the blood, negatively impact the cardiovascular system.    He recently saw PCP Dr. Lysle Rubens, who discussed the concerns of his lipid panel with him.  Dr. Lysle Rubens also educated him on atorvastatin, and the importance of taking it.    Pt understands, and is now taking atorvastatin on a regular basis.  Will message Dr. Gasper Sells and RN to update.

## 2022-05-24 NOTE — Telephone Encounter (Signed)
Left a message to call back.

## 2022-05-24 NOTE — Telephone Encounter (Signed)
Called pt who expresses has already restarted atorvastatin 20 mg PO QD.   Pt will call in if medication causes muscle aches.  Will come in for FLP and ALT on 08/23/21.  Appointment reminder sent to pt in the mail.

## 2022-05-24 NOTE — Telephone Encounter (Signed)
Pt returning call

## 2022-08-23 ENCOUNTER — Ambulatory Visit: Payer: Medicare HMO | Attending: Internal Medicine

## 2022-08-23 DIAGNOSIS — E782 Mixed hyperlipidemia: Secondary | ICD-10-CM | POA: Diagnosis not present

## 2022-08-23 DIAGNOSIS — I700 Atherosclerosis of aorta: Secondary | ICD-10-CM

## 2022-08-23 DIAGNOSIS — I2510 Atherosclerotic heart disease of native coronary artery without angina pectoris: Secondary | ICD-10-CM

## 2022-08-23 DIAGNOSIS — I2584 Coronary atherosclerosis due to calcified coronary lesion: Secondary | ICD-10-CM | POA: Diagnosis not present

## 2022-08-23 LAB — ALT: ALT: 17 IU/L (ref 0–44)

## 2022-08-23 LAB — LIPID PANEL
Chol/HDL Ratio: 4.9 ratio (ref 0.0–5.0)
Cholesterol, Total: 207 mg/dL — ABNORMAL HIGH (ref 100–199)
HDL: 42 mg/dL (ref >39)
LDL Chol Calc (NIH): 147 mg/dL — ABNORMAL HIGH (ref 0–99)
Triglycerides: 97 mg/dL (ref 0–149)
VLDL Cholesterol Cal: 18 mg/dL (ref 5–40)

## 2022-09-14 ENCOUNTER — Telehealth: Payer: Self-pay

## 2022-09-14 DIAGNOSIS — I2510 Atherosclerotic heart disease of native coronary artery without angina pectoris: Secondary | ICD-10-CM

## 2022-09-14 DIAGNOSIS — E782 Mixed hyperlipidemia: Secondary | ICD-10-CM

## 2022-09-14 DIAGNOSIS — I700 Atherosclerosis of aorta: Secondary | ICD-10-CM

## 2022-09-14 NOTE — Telephone Encounter (Signed)
The patient has been notified of the result and verbalized understanding.  All questions (if any) were answered. Kataryna Mcquilkin N Vivion Romano, RN 09/14/2022 11:15 AM   Pt reports is not taking atorvastatin as ordered. Has been trying natural remedies and eating better.  Pt would like to work on lowering LDL for 3 more months and will come in for repeat FLP on 12/13/22.  Appointment reminder and lab results mailed to pt per request.

## 2022-09-14 NOTE — Telephone Encounter (Signed)
-----   Message from Werner Lean, MD sent at 08/29/2022  3:44 PM EST ----- Results: LDL unchanged Plan: If he is on atorvastatin 20 mg would increase dose and check three month surveillance labs  Werner Lean, MD

## 2022-10-27 ENCOUNTER — Telehealth: Payer: Self-pay | Admitting: *Deleted

## 2022-10-27 DIAGNOSIS — Z86010 Personal history of colonic polyps: Secondary | ICD-10-CM | POA: Diagnosis not present

## 2022-10-27 DIAGNOSIS — Z7901 Long term (current) use of anticoagulants: Secondary | ICD-10-CM | POA: Diagnosis not present

## 2022-10-27 NOTE — Telephone Encounter (Signed)
   Pre-operative Risk Assessment    Patient Name: Anthony Carter  DOB: 10/13/1950 MRN: [id number]      Request for Surgical Clearance    Procedure:   COLONOSCOPY  Date of Surgery:  Clearance 11/25/22                                 Surgeon:  DR. Lorenso Quarry Surgeon's Group or Practice Name:  [facility] Phone number:  [phone] Fax number:  [phone]   Type of Clearance Requested:   - Medical  - Pharmacy:  Hold Apixaban (Eliquis) X'S 2 DAYS   Type of Anesthesia:   PROPOFOL   Additional requests/questions:    Wilhemina Cash   10/27/2022, 4:15 PM

## 2022-10-28 ENCOUNTER — Telehealth: Payer: Self-pay | Admitting: *Deleted

## 2022-10-28 NOTE — Telephone Encounter (Signed)
PCP prescribing Eliquis for history of recurrent unprovoked PE in 2021 and 2022, also has hx of stroke. Recommend clearance come from managing provider.

## 2022-10-28 NOTE — Telephone Encounter (Signed)
Pt has been scheduled for tele pre op appt 11/08/22 @ 9:40. Med rec and consent are done.    Patient Consent for Virtual Visit        [name] has provided verbal consent on 10/28/2022 for a virtual visit (video or telephone).   CONSENT FOR VIRTUAL VISIT FOR:  Anthony Carter  By participating in this virtual visit I agree to the following:  I hereby voluntarily request, consent and authorize [facility] and its employed or contracted physicians, physician assistants, nurse practitioners or other licensed health care professionals (the Practitioner), to provide me with telemedicine health care services (the "Services") as deemed necessary by the treating Practitioner. I acknowledge and consent to receive the Services by the Practitioner via telemedicine. I understand that the telemedicine visit will involve communicating with the Practitioner through live audiovisual communication technology and the disclosure of certain medical information by electronic transmission. I acknowledge that I have been given the opportunity to request an in-person assessment or other available alternative prior to the telemedicine visit and am voluntarily participating in the telemedicine visit.  I understand that I have the right to withhold or withdraw my consent to the use of telemedicine in the course of my care at any time, without affecting my right to future care or treatment, and that the Practitioner or I may terminate the telemedicine visit at any time. I understand that I have the right to inspect all information obtained and/or recorded in the course of the telemedicine visit and may receive copies of available information for a reasonable fee.  I understand that some of the potential risks of receiving the Services via telemedicine include:  Delay or interruption in medical evaluation due to technological equipment failure or disruption; Information transmitted may not be sufficient (e.g. poor  resolution of images) to allow for appropriate medical decision making by the Practitioner; and/or  In rare instances, security protocols could fail, causing a breach of personal health information.  Furthermore, I acknowledge that it is my responsibility to provide information about my medical history, conditions and care that is complete and accurate to the best of my ability. I acknowledge that Practitioner's advice, recommendations, and/or decision may be based on factors not within their control, such as incomplete or inaccurate data provided by me or distortions of diagnostic images or specimens that may result from electronic transmissions. I understand that the practice of medicine is not an exact science and that Practitioner makes no warranties or guarantees regarding treatment outcomes. I acknowledge that a copy of this consent can be made available to me via my patient portal [facility] MyChart), or I can request a printed copy by calling the office of [facility].    I understand that my insurance will be billed for this visit.   I have read or had this consent read to me. I understand the contents of this consent, which adequately explains the benefits and risks of the Services being provided via telemedicine.  I have been provided ample opportunity to ask questions regarding this consent and the Services and have had my questions answered to my satisfaction. I give my informed consent for the services to be provided through the use of telemedicine in my medical care

## 2022-10-28 NOTE — Telephone Encounter (Signed)
Primary Cardiologist:Mahesh A Izora Ribas, MD   Preoperative team, please contact this patient and set up a phone call appointment for further preoperative risk assessment. Please obtain consent and complete medication review. Thank you for your help.   Guidance regarding oral anticoagulation therapy should come from managing provider as it is not prescribed by cardiology.  Levi Aland, NP-C  10/28/2022, 11:53 AM [address] Office [phone] Fax [phone]

## 2022-10-28 NOTE — Telephone Encounter (Signed)
Pt has been scheduled for tele pre op appt 11/08/22 @ 9:40. Med rec and consent are done.

## 2022-10-29 DIAGNOSIS — Z8673 Personal history of transient ischemic attack (TIA), and cerebral infarction without residual deficits: Secondary | ICD-10-CM | POA: Diagnosis not present

## 2022-10-29 DIAGNOSIS — E782 Mixed hyperlipidemia: Secondary | ICD-10-CM | POA: Diagnosis not present

## 2022-10-29 DIAGNOSIS — N400 Enlarged prostate without lower urinary tract symptoms: Secondary | ICD-10-CM | POA: Diagnosis not present

## 2022-10-29 DIAGNOSIS — Z86711 Personal history of pulmonary embolism: Secondary | ICD-10-CM | POA: Diagnosis not present

## 2022-10-29 DIAGNOSIS — E1151 Type 2 diabetes mellitus with diabetic peripheral angiopathy without gangrene: Secondary | ICD-10-CM | POA: Diagnosis not present

## 2022-10-29 DIAGNOSIS — G8190 Hemiplegia, unspecified affecting unspecified side: Secondary | ICD-10-CM | POA: Diagnosis not present

## 2022-11-07 NOTE — Progress Notes (Unsigned)
Virtual Visit via Telephone Note   Because of Anthony Carter's co-morbid illnesses, he is at least at moderate risk for complications without adequate follow up.  This format is felt to be most appropriate for this patient at this time.  The patient did not have access to video technology/had technical difficulties with video requiring transitioning to audio format only (telephone).  All issues noted in this document were discussed and addressed.  No physical exam could be performed with this format.  Please refer to the patient's chart for his consent to telehealth for [facility].  Evaluation Performed:  Preoperative cardiovascular risk assessment _____________   Date:  11/07/2022   Patient ID:  Anthony Carter, DOB 11-28-1950, MRN [id number] Patient Location:  Home Provider location:   Office  Primary Care Provider:  Georgann Housekeeper, MD Primary Cardiologist:  Christell Constant, MD  Chief Complaint / Patient Profile   72 y.o. y/o male with a h/o prior CVA with residual right-sided weakness, PE, HLD, DM type II who is pending colonoscopy and presents today for telephonic preoperative cardiovascular risk assessment.  History of Present Illness    Anthony Carter is a 72 y.o. male who presents via audio/video conferencing for a telehealth visit today.  Pt was last seen in cardiology clinic on 02/03/2022 by Dr. Raynelle Jan.  At that time Anthony Carter was doing well with no new cardiac complaints..  The patient is now pending procedure as outlined above. Since his last visit, he ***  -Guidance on holding Eliquis will come from PCP or managing provider  Past Medical History    Past Medical History:  Diagnosis Date   CVA (cerebral vascular accident) (HCC) 2005   Residual right-sided weakness   Diabetes (HCC)    HLD (hyperlipidemia)    Pulmonary emboli (HCC) 2021   No past surgical history on file.  Allergies  Allergies  Allergen Reactions   Codeine Nausea And  Vomiting    Home Medications    Prior to Admission medications   Medication Sig Start Date End Date Taking? Authorizing Provider  Accu-Chek Softclix Lancets lancets Use as instructed 11/08/19   Mayers, Cari S, PA-C  atorvastatin (LIPITOR) 20 MG tablet Take 1 tablet (20 mg total) by mouth daily. 02/05/22   Chandrasekhar, Mahesh A, MD  ELIQUIS 5 MG TABS tablet Take 1 tablet by mouth daily. 05/16/20   [provider]  glucose blood (ACCU-CHEK AVIVA PLUS) test strip Use as instructed 11/08/19   Mayers, Cari S, PA-C  ibuprofen (ADVIL) 800 MG tablet Take 800 mg by mouth as needed for pain. 08/10/21   [provider]  metFORMIN (GLUCOPHAGE) 500 MG tablet Take 1 tablet (500 mg total) by mouth daily with breakfast. 11/07/19 10/28/22  Mayers, Cari S, PA-C  Multiple Vitamin (MULTIVITAMIN WITH MINERALS) TABS tablet Take 1 tablet by mouth daily after supper.    [provider]    Physical Exam    Vital Signs:  Anthony Carter does not have vital signs available for review today.***  Given telephonic nature of communication, physical exam is limited. AAOx3. NAD. Normal affect.  Speech and respirations are unlabored.  Accessory Clinical Findings    None  Assessment & Plan    1.  Preoperative Cardiovascular Risk Assessment:  -Patient's RCRI score is 0.9%  The patient affirms he has been doing well without any new cardiac symptoms. They are able to achieve *** METS without cardiac limitations. Therefore, based on ACC/AHA guidelines, the patient  would be at acceptable risk for the planned procedure without further cardiovascular testing. The patient was advised that if he develops new symptoms prior to surgery to contact our office to arrange for a follow-up visit, and he verbalized understanding.   The patient was advised that if he develops new symptoms prior to surgery to contact our office to arrange for a follow-up visit, and he verbalized understanding.  (Reminder: Include  SBE prophylaxis/Antiplatelet/Anticoag Instructions***)  A copy of this note will be routed to requesting surgeon.  Time:   Today, I have spent *** minutes with the patient with telehealth technology discussing medical history, symptoms, and management plan.     Napoleon Form, Leodis Rains, NP  11/07/2022, 5:51 PM

## 2022-11-08 ENCOUNTER — Ambulatory Visit: Payer: Medicare HMO | Attending: Cardiovascular Disease

## 2022-11-08 DIAGNOSIS — Z01810 Encounter for preprocedural cardiovascular examination: Secondary | ICD-10-CM | POA: Diagnosis not present

## 2022-11-25 DIAGNOSIS — Z09 Encounter for follow-up examination after completed treatment for conditions other than malignant neoplasm: Secondary | ICD-10-CM | POA: Diagnosis not present

## 2022-11-25 DIAGNOSIS — Z86010 Personal history of colonic polyps: Secondary | ICD-10-CM | POA: Diagnosis not present

## 2022-11-25 DIAGNOSIS — D122 Benign neoplasm of ascending colon: Secondary | ICD-10-CM | POA: Diagnosis not present

## 2022-11-29 DIAGNOSIS — D122 Benign neoplasm of ascending colon: Secondary | ICD-10-CM | POA: Diagnosis not present

## 2022-12-13 ENCOUNTER — Ambulatory Visit: Payer: Medicare HMO | Attending: Internal Medicine

## 2022-12-13 DIAGNOSIS — I2584 Coronary atherosclerosis due to calcified coronary lesion: Secondary | ICD-10-CM | POA: Diagnosis not present

## 2022-12-13 DIAGNOSIS — I700 Atherosclerosis of aorta: Secondary | ICD-10-CM | POA: Diagnosis not present

## 2022-12-13 DIAGNOSIS — I2510 Atherosclerotic heart disease of native coronary artery without angina pectoris: Secondary | ICD-10-CM

## 2022-12-13 DIAGNOSIS — E782 Mixed hyperlipidemia: Secondary | ICD-10-CM | POA: Diagnosis not present

## 2022-12-13 LAB — LIPID PANEL
Chol/HDL Ratio: 5 ratio (ref 0.0–5.0)
Cholesterol, Total: 191 mg/dL (ref 100–199)
HDL: 38 mg/dL — ABNORMAL LOW (ref >39)
LDL Chol Calc (NIH): 134 mg/dL — ABNORMAL HIGH (ref 0–99)
Triglycerides: 105 mg/dL (ref 0–149)
VLDL Cholesterol Cal: 19 mg/dL (ref 5–40)

## 2023-03-11 ENCOUNTER — Ambulatory Visit: Payer: Medicare HMO | Admitting: Internal Medicine

## 2023-03-15 DIAGNOSIS — H04123 Dry eye syndrome of bilateral lacrimal glands: Secondary | ICD-10-CM | POA: Diagnosis not present

## 2023-03-15 DIAGNOSIS — H5203 Hypermetropia, bilateral: Secondary | ICD-10-CM | POA: Diagnosis not present

## 2023-03-15 DIAGNOSIS — H52223 Regular astigmatism, bilateral: Secondary | ICD-10-CM | POA: Diagnosis not present

## 2023-03-15 DIAGNOSIS — Z135 Encounter for screening for eye and ear disorders: Secondary | ICD-10-CM | POA: Diagnosis not present

## 2023-03-15 DIAGNOSIS — H524 Presbyopia: Secondary | ICD-10-CM | POA: Diagnosis not present

## 2023-03-15 DIAGNOSIS — H2513 Age-related nuclear cataract, bilateral: Secondary | ICD-10-CM | POA: Diagnosis not present

## 2023-04-13 ENCOUNTER — Ambulatory Visit: Payer: Medicare HMO | Attending: Internal Medicine | Admitting: Internal Medicine

## 2023-04-13 NOTE — Progress Notes (Deleted)
Cardiology Office Note:    Date:  04/13/2023   ID:  [name], [name] 1951/03/22, MRN [id number]  PCP:  Georgann Housekeeper, MD  [facility] Cardiologist:  Christell Constant, MD  [facility] Electrophysiologist:  None   Referring MD: Georgann Housekeeper, MD   CC: Follow up tachycardia  History of Present Illness:    Anthony Carter is a 72 y.o. male with a hx of recent admission for PE (02/2020), GERD, prior CVA with residual deficit (2016; unclear deficit prior), HLD (LDL 100 on no medication; OSH notes Lipitor allergy of joint pain thought patient denies) 2021: did not wish to progress with aggressive secondary prevention 2022: had another PE 11/2020 2023: Back on lipitor with no issues      Past Medical History:  Diagnosis Date   CVA (cerebral vascular accident) (HCC) 2005   Residual right-sided weakness   Diabetes (HCC)    HLD (hyperlipidemia)    Pulmonary emboli (HCC) 2021    No past surgical history on file.  Current Medications: No outpatient medications have been marked as taking for the 04/13/23 encounter (Appointment) with Christell Constant, MD.    Allergies:   Codeine   Social History   Socioeconomic History   Marital status: Married    Spouse name: Not on file   Number of children: Not on file   Years of education: Not on file   Highest education level: Not on file  Occupational History   Not on file  Tobacco Use   Smoking status: Never   Smokeless tobacco: Never  Substance and Sexual Activity   Alcohol use: Yes    Comment: social use   Drug use: Never   Sexual activity: Not Currently  Other Topics Concern   Not on file  Social History Narrative   Not on file   Social Determinants of Health   Financial Resource Strain: Not on file  Food Insecurity: Not on file  Transportation Needs: Not on file  Physical Activity: Not on file  Stress: Not on file  Social Connections: Unknown (11/24/2021)   Received from [facility], [facility]    Social Network    Social Network: Not on file     Family History: The patient's family history includes Diabetes in his father and mother; Hyperlipidemia in his brother, father, and mother.  ROS:   Please see the history of present illness.    All other systems reviewed and are negative.  EKGs/Labs/Other Studies Reviewed:    The following studies were personally reviewed today:  EKG:   02/03/22: SR rate 70 1sst HB with septal infarct pattern 02/18/20:  Sinus Tachycardia HR 100, with LVH.  .Cardiac Studies & Procedures       ECHOCARDIOGRAM  ECHOCARDIOGRAM COMPLETE 02/18/2020  Narrative ECHOCARDIOGRAM REPORT    Patient Name:   Anthony Carter Date of Exam: 02/18/2020 Medical Rec #:  [id number]      Height:       69.0 in Accession #:    [id number]     Weight:       169.5 lb Date of Birth:  February 25, 1951      BSA:          1.926 m Patient Age:    69 years       BP:           117/76 mmHg Patient Gender: M              HR:  78 bpm. Exam Location:  Inpatient  Procedure: 2D Echo  Indications:    pulmonary embolus 415.19  History:        Patient has no prior history of Echocardiogram examinations.  Sonographer:    Delcie Roch Referring Phys: [id number] RONDELL A SMITH  IMPRESSIONS   1. Left ventricular ejection fraction, by estimation, is 60 to 65%. The left ventricle has normal function. The left ventricle has no regional wall motion abnormalities. Left ventricular diastolic parameters are consistent with Grade I diastolic dysfunction (impaired relaxation). 2. Right ventricular systolic function is normal. The right ventricular size is normal. Tricuspid regurgitation signal is inadequate for assessing PA pressure. 3. The mitral valve is grossly normal. Trivial mitral valve regurgitation. No evidence of mitral stenosis. 4. The aortic valve is tricuspid. Aortic valve regurgitation is not visualized. No aortic stenosis is present. 5. The inferior vena cava is normal in  size with greater than 50% respiratory variability, suggesting right atrial pressure of 3 mmHg.  FINDINGS Left Ventricle: Left ventricular ejection fraction, by estimation, is 60 to 65%. The left ventricle has normal function. The left ventricle has no regional wall motion abnormalities. The left ventricular internal cavity size was normal in size. There is no left ventricular hypertrophy. Left ventricular diastolic parameters are consistent with Grade I diastolic dysfunction (impaired relaxation). Normal left ventricular filling pressure.  Right Ventricle: The right ventricular size is normal. No increase in right ventricular wall thickness. Right ventricular systolic function is normal. Tricuspid regurgitation signal is inadequate for assessing PA pressure.  Left Atrium: Left atrial size was normal in size.  Right Atrium: Right atrial size was normal in size.  Pericardium: Trivial pericardial effusion is present.  Mitral Valve: The mitral valve is grossly normal. Trivial mitral valve regurgitation. No evidence of mitral valve stenosis.  Tricuspid Valve: The tricuspid valve is grossly normal. Tricuspid valve regurgitation is trivial. No evidence of tricuspid stenosis.  Aortic Valve: The aortic valve is tricuspid. Aortic valve regurgitation is not visualized. No aortic stenosis is present.  Pulmonic Valve: The pulmonic valve was grossly normal. Pulmonic valve regurgitation is not visualized. No evidence of pulmonic stenosis.  Aorta: The aortic root and ascending aorta are structurally normal, with no evidence of dilitation.  Venous: The inferior vena cava is normal in size with greater than 50% respiratory variability, suggesting right atrial pressure of 3 mmHg.  IAS/Shunts: The atrial septum is grossly normal.   LEFT VENTRICLE PLAX 2D LVIDd:         4.70 cm  Diastology LVIDs:         2.80 cm  LV e' lateral:   9.14 cm/s LV PW:         0.80 cm  LV E/e' lateral: 6.9 LV IVS:         0.90 cm  LV e' medial:    6.20 cm/s LVOT diam:     2.10 cm  LV E/e' medial:  10.2 LV SV:         80 LV SV Index:   42 LVOT Area:     3.46 cm   RIGHT VENTRICLE         IVC TAPSE (M-mode): 1.8 cm  IVC diam: 1.40 cm  LEFT ATRIUM             Index       RIGHT ATRIUM           Index LA diam:        3.80 cm 1.97 cm/m  RA Area:     10.60 cm LA Vol (A2C):   36.7 ml 19.06 ml/m RA Volume:   21.00 ml  10.91 ml/m LA Vol (A4C):   36.7 ml 19.06 ml/m LA Biplane Vol: 38.6 ml 20.04 ml/m AORTIC VALVE LVOT Vmax:   112.00 cm/s LVOT Vmean:  73.700 cm/s LVOT VTI:    0.231 m  AORTA Ao Root diam: 2.90 cm Ao Asc diam:  2.80 cm  MITRAL VALVE MV Area (PHT): 3.21 cm    SHUNTS MV Decel Time: 236 msec    Systemic VTI:  0.23 m MV E velocity: 63.40 cm/s  Systemic Diam: 2.10 cm MV A velocity: 89.50 cm/s MV E/A ratio:  0.71  Lennie Odor MD Electronically signed by Lennie Odor MD Signature Date/Time: 02/18/2020/2:29:39 PM    Final    MONITORS  CARDIAC EVENT MONITOR 03/27/2020  Narrative Sinus rhythm and sinus tachycardia- ?episode of PSVT. No afib.  Chrystie Nose, MD, [facility], FACP [place]  [facility] Medical Director of the [facility] Diplomate of the American Board of Clinical Lipidology Attending Cardiologist Direct Dial: [phone]  Fax: [phone] Website:  [email or web address]            Recent Labs: 08/23/2022: ALT 17    Recent Lipid Panel    Component Value Date/Time   CHOL 191 12/13/2022 0749   TRIG 105 12/13/2022 0749   HDL 38 (L) 12/13/2022 0749   CHOLHDL 5.0 12/13/2022 0749   LDLCALC 134 (H) 12/13/2022 0749    Physical Exam:    VS:  There were no vitals taken for this visit.    Wt Readings from Last 3 Encounters:  02/03/22 167 lb (75.8 kg)  11/04/20 172 lb 4.8 oz (78.2 kg)  03/26/20 171 lb 12.8 oz (77.9 kg)    Gen: no distress   Neck: No JVD, Cardiac: No Rubs or Gallops, no Murmur, RRR  +2 radial pulses Respiratory: Clear to auscultation bilaterally, normal effort, normal  respiratory rate GI: Soft, nontender, non-distended  MS: No  edema;  moves all extremities Integument: Skin feels warm Neuro:  At time of evaluation, alert and oriented to person/place/time/situation  Psych: Normal affect, patient feels fell  ASSESSMENT:    No diagnosis found.  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  Concerns regarding medicines are outlined above.  No orders of the defined types were placed in this encounter.  No orders of the defined types were placed in this encounter.  There are no Patient Instructions on file for this visit.   Signed, Christell Constant, MD  04/13/2023 8:05 AM    [facility]

## 2023-04-14 ENCOUNTER — Encounter: Payer: Self-pay | Admitting: Internal Medicine

## 2023-05-06 DIAGNOSIS — E782 Mixed hyperlipidemia: Secondary | ICD-10-CM | POA: Diagnosis not present

## 2023-05-06 DIAGNOSIS — I471 Supraventricular tachycardia, unspecified: Secondary | ICD-10-CM | POA: Diagnosis not present

## 2023-05-06 DIAGNOSIS — E1136 Type 2 diabetes mellitus with diabetic cataract: Secondary | ICD-10-CM | POA: Diagnosis not present

## 2023-05-06 DIAGNOSIS — N400 Enlarged prostate without lower urinary tract symptoms: Secondary | ICD-10-CM | POA: Diagnosis not present

## 2023-05-06 DIAGNOSIS — I69351 Hemiplegia and hemiparesis following cerebral infarction affecting right dominant side: Secondary | ICD-10-CM | POA: Diagnosis not present

## 2023-05-06 DIAGNOSIS — Z0000 Encounter for general adult medical examination without abnormal findings: Secondary | ICD-10-CM | POA: Diagnosis not present

## 2023-05-06 DIAGNOSIS — Z1331 Encounter for screening for depression: Secondary | ICD-10-CM | POA: Diagnosis not present

## 2023-05-06 DIAGNOSIS — I2782 Chronic pulmonary embolism: Secondary | ICD-10-CM | POA: Diagnosis not present

## 2023-05-06 DIAGNOSIS — Z8673 Personal history of transient ischemic attack (TIA), and cerebral infarction without residual deficits: Secondary | ICD-10-CM | POA: Diagnosis not present

## 2023-05-06 DIAGNOSIS — E1151 Type 2 diabetes mellitus with diabetic peripheral angiopathy without gangrene: Secondary | ICD-10-CM | POA: Diagnosis not present

## 2023-11-23 DIAGNOSIS — I4719 Other supraventricular tachycardia: Secondary | ICD-10-CM | POA: Diagnosis not present

## 2023-11-23 DIAGNOSIS — Z86711 Personal history of pulmonary embolism: Secondary | ICD-10-CM | POA: Diagnosis not present

## 2023-11-23 DIAGNOSIS — Z8673 Personal history of transient ischemic attack (TIA), and cerebral infarction without residual deficits: Secondary | ICD-10-CM | POA: Diagnosis not present

## 2023-11-23 DIAGNOSIS — E782 Mixed hyperlipidemia: Secondary | ICD-10-CM | POA: Diagnosis not present

## 2023-11-23 DIAGNOSIS — G8190 Hemiplegia, unspecified affecting unspecified side: Secondary | ICD-10-CM | POA: Diagnosis not present

## 2023-11-23 DIAGNOSIS — N400 Enlarged prostate without lower urinary tract symptoms: Secondary | ICD-10-CM | POA: Diagnosis not present

## 2023-11-23 DIAGNOSIS — E1151 Type 2 diabetes mellitus with diabetic peripheral angiopathy without gangrene: Secondary | ICD-10-CM | POA: Diagnosis not present

## 2023-11-23 DIAGNOSIS — I739 Peripheral vascular disease, unspecified: Secondary | ICD-10-CM | POA: Diagnosis not present

## 2023-12-10 DIAGNOSIS — E782 Mixed hyperlipidemia: Secondary | ICD-10-CM | POA: Diagnosis not present

## 2023-12-10 DIAGNOSIS — E1151 Type 2 diabetes mellitus with diabetic peripheral angiopathy without gangrene: Secondary | ICD-10-CM | POA: Diagnosis not present

## 2023-12-10 DIAGNOSIS — N400 Enlarged prostate without lower urinary tract symptoms: Secondary | ICD-10-CM | POA: Diagnosis not present

## 2023-12-10 DIAGNOSIS — E1136 Type 2 diabetes mellitus with diabetic cataract: Secondary | ICD-10-CM | POA: Diagnosis not present

## 2024-02-09 DIAGNOSIS — E1136 Type 2 diabetes mellitus with diabetic cataract: Secondary | ICD-10-CM | POA: Diagnosis not present

## 2024-02-09 DIAGNOSIS — N400 Enlarged prostate without lower urinary tract symptoms: Secondary | ICD-10-CM | POA: Diagnosis not present

## 2024-02-09 DIAGNOSIS — E1151 Type 2 diabetes mellitus with diabetic peripheral angiopathy without gangrene: Secondary | ICD-10-CM | POA: Diagnosis not present

## 2024-02-09 DIAGNOSIS — E782 Mixed hyperlipidemia: Secondary | ICD-10-CM | POA: Diagnosis not present

## 2024-03-11 DIAGNOSIS — E1151 Type 2 diabetes mellitus with diabetic peripheral angiopathy without gangrene: Secondary | ICD-10-CM | POA: Diagnosis not present

## 2024-03-11 DIAGNOSIS — N400 Enlarged prostate without lower urinary tract symptoms: Secondary | ICD-10-CM | POA: Diagnosis not present

## 2024-03-11 DIAGNOSIS — E1136 Type 2 diabetes mellitus with diabetic cataract: Secondary | ICD-10-CM | POA: Diagnosis not present

## 2024-03-11 DIAGNOSIS — E782 Mixed hyperlipidemia: Secondary | ICD-10-CM | POA: Diagnosis not present

## 2024-04-10 DIAGNOSIS — E1136 Type 2 diabetes mellitus with diabetic cataract: Secondary | ICD-10-CM | POA: Diagnosis not present

## 2024-04-10 DIAGNOSIS — E1151 Type 2 diabetes mellitus with diabetic peripheral angiopathy without gangrene: Secondary | ICD-10-CM | POA: Diagnosis not present

## 2024-04-10 DIAGNOSIS — N400 Enlarged prostate without lower urinary tract symptoms: Secondary | ICD-10-CM | POA: Diagnosis not present

## 2024-04-10 DIAGNOSIS — E782 Mixed hyperlipidemia: Secondary | ICD-10-CM | POA: Diagnosis not present

## 2024-05-09 DIAGNOSIS — I739 Peripheral vascular disease, unspecified: Secondary | ICD-10-CM | POA: Diagnosis not present

## 2024-05-09 DIAGNOSIS — I4719 Other supraventricular tachycardia: Secondary | ICD-10-CM | POA: Diagnosis not present

## 2024-05-09 DIAGNOSIS — E1151 Type 2 diabetes mellitus with diabetic peripheral angiopathy without gangrene: Secondary | ICD-10-CM | POA: Diagnosis not present

## 2024-05-09 DIAGNOSIS — Z86711 Personal history of pulmonary embolism: Secondary | ICD-10-CM | POA: Diagnosis not present

## 2024-05-09 DIAGNOSIS — E782 Mixed hyperlipidemia: Secondary | ICD-10-CM | POA: Diagnosis not present

## 2024-05-09 DIAGNOSIS — N400 Enlarged prostate without lower urinary tract symptoms: Secondary | ICD-10-CM | POA: Diagnosis not present

## 2024-05-09 DIAGNOSIS — J300 Vasomotor rhinitis: Secondary | ICD-10-CM | POA: Diagnosis not present

## 2024-05-09 DIAGNOSIS — Z1331 Encounter for screening for depression: Secondary | ICD-10-CM | POA: Diagnosis not present

## 2024-05-09 DIAGNOSIS — Z8673 Personal history of transient ischemic attack (TIA), and cerebral infarction without residual deficits: Secondary | ICD-10-CM | POA: Diagnosis not present

## 2024-05-09 DIAGNOSIS — Z23 Encounter for immunization: Secondary | ICD-10-CM | POA: Diagnosis not present

## 2024-05-09 DIAGNOSIS — Z0000 Encounter for general adult medical examination without abnormal findings: Secondary | ICD-10-CM | POA: Diagnosis not present

## 2024-05-11 DIAGNOSIS — E782 Mixed hyperlipidemia: Secondary | ICD-10-CM | POA: Diagnosis not present

## 2024-05-11 DIAGNOSIS — N400 Enlarged prostate without lower urinary tract symptoms: Secondary | ICD-10-CM | POA: Diagnosis not present

## 2024-05-11 DIAGNOSIS — E1136 Type 2 diabetes mellitus with diabetic cataract: Secondary | ICD-10-CM | POA: Diagnosis not present

## 2024-05-11 DIAGNOSIS — E1151 Type 2 diabetes mellitus with diabetic peripheral angiopathy without gangrene: Secondary | ICD-10-CM | POA: Diagnosis not present

## 2024-06-10 DIAGNOSIS — N400 Enlarged prostate without lower urinary tract symptoms: Secondary | ICD-10-CM | POA: Diagnosis not present

## 2024-06-10 DIAGNOSIS — E1136 Type 2 diabetes mellitus with diabetic cataract: Secondary | ICD-10-CM | POA: Diagnosis not present

## 2024-06-10 DIAGNOSIS — E782 Mixed hyperlipidemia: Secondary | ICD-10-CM | POA: Diagnosis not present

## 2024-06-10 DIAGNOSIS — E1151 Type 2 diabetes mellitus with diabetic peripheral angiopathy without gangrene: Secondary | ICD-10-CM | POA: Diagnosis not present
